# Patient Record
Sex: Female | Born: 1995 | Race: Black or African American | Hispanic: No | Smoking: Current every day smoker
Health system: Southern US, Community
[De-identification: ages and names within clinical notes are randomized; demographics above are authoritative.]

## PROBLEM LIST (undated history)

## (undated) DIAGNOSIS — F909 Attention-deficit hyperactivity disorder, unspecified type: Secondary | ICD-10-CM

## (undated) DIAGNOSIS — R7303 Prediabetes: Secondary | ICD-10-CM

## (undated) DIAGNOSIS — D649 Anemia, unspecified: Secondary | ICD-10-CM

## (undated) DIAGNOSIS — G4700 Insomnia, unspecified: Secondary | ICD-10-CM

## (undated) DIAGNOSIS — R519 Headache, unspecified: Secondary | ICD-10-CM

## (undated) DIAGNOSIS — J45909 Unspecified asthma, uncomplicated: Secondary | ICD-10-CM

## (undated) HISTORY — DX: Anemia, unspecified: D64.9

## (undated) HISTORY — DX: Headache, unspecified: R51.9

---
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
## 2018-09-24 ENCOUNTER — Ambulatory Visit (INDEPENDENT_AMBULATORY_CARE_PROVIDER_SITE_OTHER): Payer: Medicaid Other

## 2018-09-24 ENCOUNTER — Other Ambulatory Visit: Payer: Self-pay

## 2018-09-24 VITALS — BP 112/73 | HR 93 | Ht 65.5 in | Wt 166.0 lb

## 2018-09-24 DIAGNOSIS — Z3201 Encounter for pregnancy test, result positive: Secondary | ICD-10-CM

## 2018-09-24 DIAGNOSIS — Z3400 Encounter for supervision of normal first pregnancy, unspecified trimester: Secondary | ICD-10-CM

## 2018-09-24 LAB — POCT URINE PREGNANCY: Preg Test, Ur: POSITIVE — AB

## 2018-09-24 MED ORDER — BLOOD PRESSURE MONITOR KIT: 1.0000 | PACK | 0 refills | Status: DC

## 2018-09-24 NOTE — Progress Notes (Signed)
PRENATAL INTAKE SUMMARY  Ms. No presents today New OB Nurse Interview.  OB History    Gravida  1   Para  0   Term      Preterm      AB      Living        SAB      TAB      Ectopic      Multiple      Live Births             I have reviewed the patient's medical, obstetrical, social, and family histories, medications, and available lab results.  SUBJECTIVE She has no unusual complaints  OBJECTIVE Initial (New OB) Intake  GENERAL APPEARANCE: alert, well appearing   ASSESSMENT Normal pregnancy  PLAN NEW OB visit and Prenatal Labs.  BP Monitor ordered.

## 2018-10-13 ENCOUNTER — Other Ambulatory Visit: Payer: Self-pay

## 2018-10-13 ENCOUNTER — Ambulatory Visit (INDEPENDENT_AMBULATORY_CARE_PROVIDER_SITE_OTHER): Payer: Medicaid Other | Admitting: Family Medicine

## 2018-10-13 ENCOUNTER — Other Ambulatory Visit (HOSPITAL_COMMUNITY)
Admission: RE | Admit: 2018-10-13 | Discharge: 2018-10-13 | Disposition: A | Discharge status: Home / Self Care | Payer: Medicaid Other | Source: Ambulatory Visit | Attending: Family Medicine | Admitting: Family Medicine

## 2018-10-13 ENCOUNTER — Encounter: Payer: Self-pay | Admitting: Family Medicine

## 2018-10-13 VITALS — BP 105/63 | HR 77 | Temp 98.7°F | Wt 172.7 lb

## 2018-10-13 DIAGNOSIS — Z23 Encounter for immunization: Secondary | ICD-10-CM

## 2018-10-13 DIAGNOSIS — Z8659 Personal history of other mental and behavioral disorders: Secondary | ICD-10-CM

## 2018-10-13 DIAGNOSIS — Z3400 Encounter for supervision of normal first pregnancy, unspecified trimester: Secondary | ICD-10-CM | POA: Diagnosis not present

## 2018-10-13 DIAGNOSIS — Z3402 Encounter for supervision of normal first pregnancy, second trimester: Secondary | ICD-10-CM

## 2018-10-13 DIAGNOSIS — Z3A17 17 weeks gestation of pregnancy: Secondary | ICD-10-CM

## 2018-10-13 MED ORDER — BLOOD PRESSURE KIT DEVI: 1.0000 | 0 refills | Status: DC

## 2018-10-13 NOTE — Progress Notes (Signed)
   Subjective:  Kim Fritz is a 23 y.o. G1P0 at [redacted]w[redacted]d being seen today for ongoing prenatal care.  She is currently monitored for the following issues for this low-risk pregnancy and has Supervision of normal first pregnancy and History of major depression on their problem list.  Patient reports no complaints.  Contractions: Not present. Vag. Bleeding: None.  Movement: Present. Denies leaking of fluid.   The following portions of the patient's history were reviewed and updated as appropriate: allergies, current medications, past family history, past medical history, past social history, past surgical history and problem list. Problem list updated.  Objective:   Vitals:   10/13/18 1025  BP: 105/63  Pulse: 77  Temp: 98.7 F (37.1 C)  Weight: 172 lb 11.2 oz (78.3 kg)    Fetal Status: Fetal Heart Rate (bpm): 145   Movement: Present     General:  Alert, oriented and cooperative. Patient is in no acute distress.  Skin: Skin is warm and dry. No rash noted.   Cardiovascular: Normal heart rate noted  Respiratory: Normal respiratory effort, no problems with respiration noted  Abdomen: Soft, gravid, appropriate for gestational age. Pain/Pressure: Absent     Pelvic: Vag. Bleeding: None     Normal vaginal mucosa without exudate, cervix normal appearance without lesions        Extremities: Normal range of motion.  Edema: None  Mental Status: Normal mood and affect. Normal behavior. Normal judgment and thought content.   Urinalysis:      Assessment and Plan:  Pregnancy: G1P0 at [redacted]w[redacted]d  1. Supervision of normal first pregnancy, antepartum - Firm LMP with regular periods prior to conception - Has supportive partner, they are excited for pregnancy - Elects for genetic testing after counseling - Ordered for anatomy scan - Prenatal lab panel ordered, pap and infectious swab collected - Accepts flu today - Warning signs reviewed   - Cytology - PAP( [place]) - Cervicovaginal ancillary  only( [place]) - Enroll Patient in Babyscripts - Babyscripts Schedule Optimization - Blood Pressure Monitoring (BLOOD PRESSURE KIT) DEVI; 1 kit by Does not apply route once a week. Check BP Weekly.  Large Cuff.  DX: Z13.6  Z34.86  O09.0  Dispense: 1 kit; Refill: 0 - Obstetric Panel, Including HIV - Culture, OB Urine - Genetic Screening - Flu Vaccine QUAD 36+ mos IM (Fluarix, Quad PF) - US MFM OB COMP + 14 WK; Future - Varicella zoster antibody, IgG  2. History of major depression - Endorses remote history in high school of depression and anxiety with medications - No meds currently, denies any mood issues  Preterm labor symptoms and general obstetric precautions including but not limited to vaginal bleeding, contractions, leaking of fluid and fetal movement were reviewed in detail with the patient. Please refer to After Visit Summary for other counseling recommendations.  Return in about 4 weeks (around 11/10/2018) for LRC, in person.   Eckstat, Matthew M, MD  

## 2018-10-13 NOTE — Progress Notes (Signed)
NEW OB.  FLU given in RD, tolerated well.

## 2018-10-13 NOTE — Patient Instructions (Signed)

## 2018-10-14 LAB — CERVICOVAGINAL ANCILLARY ONLY
Bacterial Vaginitis (gardnerella): POSITIVE — AB
Candida Glabrata: NEGATIVE
Candida Vaginitis: NEGATIVE
Molecular Disclaimer: NEGATIVE
Molecular Disclaimer: NEGATIVE
Molecular Disclaimer: NEGATIVE
Molecular Disclaimer: NORMAL
Trichomonas: POSITIVE — AB

## 2018-10-14 LAB — OBSTETRIC PANEL, INCLUDING HIV
Basophils Absolute: 0.1 10*3/uL (ref 0.0–0.2)
Basos: 1 %
EOS (ABSOLUTE): 0.3 10*3/uL (ref 0.0–0.4)
Eos: 3 %
HIV Screen 4th Generation wRfx: NONREACTIVE
Hematocrit: 29.1 % — ABNORMAL LOW (ref 34.0–46.6)
Hemoglobin: 9.4 g/dL — ABNORMAL LOW (ref 11.1–15.9)
Hepatitis B Surface Ag: NEGATIVE
Immature Grans (Abs): 0.1 10*3/uL (ref 0.0–0.1)
Immature Granulocytes: 1 %
Lymphocytes Absolute: 1.3 10*3/uL (ref 0.7–3.1)
Lymphs: 17 %
MCH: 32 pg (ref 26.6–33.0)
MCHC: 32.3 g/dL (ref 31.5–35.7)
MCV: 99 fL — ABNORMAL HIGH (ref 79–97)
Monocytes Absolute: 0.8 10*3/uL (ref 0.1–0.9)
Monocytes: 10 %
Neutrophils Absolute: 5.4 10*3/uL (ref 1.4–7.0)
Neutrophils: 68 %
Platelets: 401 10*3/uL (ref 150–450)
RBC: 2.94 x10E6/uL — ABNORMAL LOW (ref 3.77–5.28)
RDW: 10.8 % — ABNORMAL LOW (ref 11.7–15.4)
RPR Ser Ql: NONREACTIVE
Rh Factor: POSITIVE
Rubella Antibodies, IGG: 1.62 index (ref >0.99)
WBC: 7.9 10*3/uL (ref 3.4–10.8)

## 2018-10-14 LAB — AB SCR+ANTIBODY ID: Antibody Screen: POSITIVE — AB

## 2018-10-14 LAB — VARICELLA ZOSTER ANTIBODY, IGG: Varicella zoster IgG: <135 index — ABNORMAL LOW (ref >165)

## 2018-10-14 MED ORDER — BLOOD PRESSURE KIT DEVI: 1.0000 | 0 refills | Status: DC

## 2018-10-14 NOTE — Addendum Note (Signed)
Addended by: Tamela Oddi on: 10/14/2018 05:01 PM   Modules accepted: Orders

## 2018-10-15 LAB — CERVICOVAGINAL ANCILLARY ONLY
Chlamydia: NEGATIVE
Neisseria Gonorrhea: NEGATIVE

## 2018-10-17 LAB — CYTOLOGY - PAP
Diagnosis: NEGATIVE
High risk HPV: NEGATIVE
Molecular Disclaimer: 56
Molecular Disclaimer: DETECTED
Molecular Disclaimer: NORMAL

## 2018-10-18 ENCOUNTER — Telehealth: Payer: Self-pay

## 2018-10-18 ENCOUNTER — Other Ambulatory Visit: Payer: Self-pay | Admitting: Obstetrics

## 2018-10-18 DIAGNOSIS — A5901 Trichomonal vulvovaginitis: Secondary | ICD-10-CM

## 2018-10-18 DIAGNOSIS — B9689 Other specified bacterial agents as the cause of diseases classified elsewhere: Secondary | ICD-10-CM

## 2018-10-18 LAB — OB RESULTS CONSOLE GBS: GBS: POSITIVE

## 2018-10-18 LAB — CULTURE, OB URINE

## 2018-10-18 LAB — URINE CULTURE, OB REFLEX

## 2018-10-18 MED ORDER — TINIDAZOLE 500 MG PO TABS: 2.0000 g | ORAL_TABLET | Freq: Every day | ORAL | 0 refills | Status: DC

## 2018-10-18 NOTE — Telephone Encounter (Signed)
TC to pt to make aware of results  Pt not ava left message at 9:34am for pt to return call to office.

## 2018-10-18 NOTE — Telephone Encounter (Signed)
-----   Message from Clarnce Flock, MD sent at 10/16/2018  6:03 AM EDT ----- Regarding: Abnormal results Hello,  This patient's swab came back positive for BV and trichomonas but she does not have My Chart. Can someone call her, offer treatment, and offer her partner treatment for the trich, as well as counseling on not having intercourse for at least 1 wk after both of them finish their antibiotics.   Dr. Dione Plover

## 2018-10-21 ENCOUNTER — Encounter: Payer: Self-pay | Admitting: Family Medicine

## 2018-10-21 DIAGNOSIS — O09899 Supervision of other high risk pregnancies, unspecified trimester: Secondary | ICD-10-CM | POA: Insufficient documentation

## 2018-10-21 DIAGNOSIS — O361990 Maternal care for other isoimmunization, unspecified trimester, not applicable or unspecified: Secondary | ICD-10-CM | POA: Insufficient documentation

## 2018-10-21 DIAGNOSIS — R8271 Bacteriuria: Secondary | ICD-10-CM | POA: Insufficient documentation

## 2018-10-22 ENCOUNTER — Other Ambulatory Visit: Payer: Self-pay

## 2018-10-22 DIAGNOSIS — A5901 Trichomonal vulvovaginitis: Secondary | ICD-10-CM

## 2018-10-22 MED ORDER — TINIDAZOLE 500 MG PO TABS: 2.0000 g | ORAL_TABLET | Freq: Once | ORAL | 1 refills | Status: DC

## 2018-10-22 NOTE — Progress Notes (Signed)
Rx sent for Trichomonas

## 2018-10-25 ENCOUNTER — Encounter: Payer: Self-pay | Admitting: Obstetrics and Gynecology

## 2018-10-26 ENCOUNTER — Other Ambulatory Visit (HOSPITAL_COMMUNITY): Payer: Self-pay | Admitting: *Deleted

## 2018-10-26 ENCOUNTER — Other Ambulatory Visit: Payer: Self-pay

## 2018-10-26 ENCOUNTER — Ambulatory Visit (HOSPITAL_COMMUNITY)
Admission: RE | Admit: 2018-10-26 | Discharge: 2018-10-26 | Disposition: A | Discharge status: Home / Self Care | Payer: Medicaid Other | Source: Ambulatory Visit | Attending: Obstetrics and Gynecology | Admitting: Obstetrics and Gynecology

## 2018-10-26 DIAGNOSIS — Z363 Encounter for antenatal screening for malformations: Secondary | ICD-10-CM | POA: Diagnosis not present

## 2018-10-26 DIAGNOSIS — Z3A19 19 weeks gestation of pregnancy: Secondary | ICD-10-CM

## 2018-10-26 DIAGNOSIS — Z3400 Encounter for supervision of normal first pregnancy, unspecified trimester: Secondary | ICD-10-CM

## 2018-10-26 DIAGNOSIS — Z362 Encounter for other antenatal screening follow-up: Secondary | ICD-10-CM

## 2018-10-27 ENCOUNTER — Telehealth: Payer: Self-pay | Admitting: *Deleted

## 2018-10-27 DIAGNOSIS — A5901 Trichomonal vulvovaginitis: Secondary | ICD-10-CM

## 2018-10-27 DIAGNOSIS — B9689 Other specified bacterial agents as the cause of diseases classified elsewhere: Secondary | ICD-10-CM

## 2018-10-27 MED ORDER — TINIDAZOLE 500 MG PO TABS: 2.0000 g | ORAL_TABLET | Freq: Every day | ORAL | 0 refills | Status: DC

## 2018-10-27 NOTE — Telephone Encounter (Signed)
Patient called requesting to change pharmacies and resend medication. Pt stated Tindamax is cheaper at [facility]. Pt did not pick up Rx from [facility] to due cost. Rx resent to [facility] per request.  Derl Barrow, RN

## 2018-11-10 ENCOUNTER — Ambulatory Visit (INDEPENDENT_AMBULATORY_CARE_PROVIDER_SITE_OTHER): Payer: Medicaid Other | Admitting: Family Medicine

## 2018-11-10 ENCOUNTER — Other Ambulatory Visit: Payer: Self-pay

## 2018-11-10 VITALS — BP 101/63 | HR 87 | Wt 179.0 lb

## 2018-11-10 DIAGNOSIS — O361990 Maternal care for other isoimmunization, unspecified trimester, not applicable or unspecified: Secondary | ICD-10-CM

## 2018-11-10 DIAGNOSIS — Z3A21 21 weeks gestation of pregnancy: Secondary | ICD-10-CM

## 2018-11-10 DIAGNOSIS — Z3400 Encounter for supervision of normal first pregnancy, unspecified trimester: Secondary | ICD-10-CM

## 2018-11-10 DIAGNOSIS — A5901 Trichomonal vulvovaginitis: Secondary | ICD-10-CM

## 2018-11-10 DIAGNOSIS — O99820 Streptococcus B carrier state complicating pregnancy: Secondary | ICD-10-CM

## 2018-11-10 DIAGNOSIS — R8271 Bacteriuria: Secondary | ICD-10-CM

## 2018-11-10 DIAGNOSIS — O23592 Infection of other part of genital tract in pregnancy, second trimester: Secondary | ICD-10-CM

## 2018-11-10 DIAGNOSIS — O361920 Maternal care for other isoimmunization, second trimester, not applicable or unspecified: Secondary | ICD-10-CM

## 2018-11-10 NOTE — Patient Instructions (Signed)
Trichomoniasis Trichomoniasis is an STI (sexually transmitted infection) that can affect both women and men. In women, the outer area of the female genitalia (vulva) and the vagina are affected. In men, mainly the penis is affected, but the prostate and other reproductive organs can also be involved.  This condition can be treated with medicine. It often has no symptoms (is asymptomatic), especially in men. If not treated, trichomoniasis can last for months or years. What are the causes? This condition is caused by a parasite called Trichomonas vaginalis. Trichomoniasis most often spreads from person to person (is contagious) through sexual contact. What increases the risk? The following factors may make you more likely to develop this condition:  Having unprotected sex.  Having sex with a partner who has trichomoniasis.  Having multiple sexual partners.  Having had previous trichomoniasis infections or other STIs. What are the signs or symptoms? In women, symptoms of trichomoniasis include:  Abnormal vaginal discharge that is clear, white, gray, or yellow-green and foamy and has an unusual "fishy" odor.  Itching and irritation of the vagina and vulva.  Burning or pain during urination or sex.  Redness and swelling of the genitals. In men, symptoms of trichomoniasis include:  Penile discharge that may be foamy or contain pus.  Pain in the penis. This may happen only when urinating.  Itching or irritation inside the penis.  Burning after urination or ejaculation. How is this diagnosed? In women, this condition may be found during a routine Pap test or physical exam. It may be found in men during a routine physical exam. Your health care provider may do tests to help diagnose this infection, such as:  Urine tests (men and women).  The following in women: ? Testing the pH of the vagina. ? A vaginal swab test that checks for the Trichomonas vaginalis parasite. ? Testing vaginal  secretions. Your health care provider may test you for other STIs, including HIV (human immunodeficiency virus). How is this treated? This condition is treated with medicine taken by mouth (orally), such as metronidazole or tinidazole, to fight the infection. Your sexual partner(s) also need to be tested and treated.  If you are a woman and you plan to become pregnant or think you may be pregnant, tell your health care provider right away. Some medicines that are used to treat the infection should not be taken during pregnancy. Your health care provider may recommend over-the-counter medicines or creams to help relieve itching or irritation. You may be tested for infection again 3 months after treatment. Follow these instructions at home:  Take and use over-the-counter and prescription medicines, including creams, only as told by your health care provider.  Take your antibiotic medicine as told by your health care provider. Do not stop taking the antibiotic even if you start to feel better.  Do not have sex until 7-10 days after you finish your medicine, or until your health care provider approves. Ask your health care provider when you may start to have sex again.  (Women) Do not douche or wear tampons while you have the infection.  Discuss your infection with your sexual partner(s). Make sure that your partner gets tested and treated, if necessary.  Keep all follow-up visits as told by your health care provider. This is important. How is this prevented?   Use condoms every time you have sex. Using condoms correctly and consistently can help protect against STIs.  Avoid having multiple sexual partners.  Talk with your sexual partner about any   symptoms that either of you may have, as well as any history of STIs.  Get tested for STIs and STDs (sexually transmitted diseases) before you have sex. Ask your partner to do the same.  Do not have sexual contact if you have symptoms of  trichomoniasis or another STI. Contact a health care provider if:  You still have symptoms after you finish your medicine.  You develop pain in your abdomen.  You have pain when you urinate.  You have bleeding after sex.  You develop a rash.  You feel nauseous or you vomit.  You plan to become pregnant or think you may be pregnant. Summary  Trichomoniasis is an STI (sexually transmitted infection) that can affect both women and men.  This condition often has no symptoms (is asymptomatic), especially in men.  Without treatment, this condition can last for months or years.  You should not have sex until 7-10 days after you finish your medicine, or until your health care provider approves. Ask your health care provider when you may start to have sex again.  Discuss your infection with your sexual partner(s). Make sure that your partner gets tested and treated, if necessary. This information is not intended to replace advice given to you by your health care provider. Make sure you discuss any questions you have with your health care provider. Document Released: 07/09/2000 Document Revised: 10/27/2017 Document Reviewed: 10/27/2017 Elsevier Patient Education  2020 Elsevier Inc.  

## 2018-11-10 NOTE — Progress Notes (Addendum)
ROB/AFP.  Completed Tindamax  Yesterday, 11/09/18.  C/o eczema on neck

## 2018-11-10 NOTE — Progress Notes (Signed)
Subjective:  Kim Fritz is a 23 y.o. G1P0 at [redacted]w[redacted]d being seen today for ongoing prenatal care.  She is currently monitored for the following issues for this low-risk pregnancy and has Supervision of normal first pregnancy; History of major depression; Anti-M isoimmunization affecting pregnancy, antepartum; GBS bacteriuria; Maternal varicella, non-immune; and Trichomonal vaginitis during pregnancy on their problem list.  Patient reports no complaints.  Contractions: Not present. Vag. Bleeding: None.  Movement: Present. Denies leaking of fluid.   The following portions of the patient's history were reviewed and updated as appropriate: allergies, current medications, past family history, past medical history, past social history, past surgical history and problem list. Problem list updated.  Objective:   Vitals:   11/10/18 1029  BP: 101/63  Pulse: 87  Weight: 179 lb (81.2 kg)    Fetal Status:     Movement: Present     General:  Alert, oriented and cooperative. Patient is in no acute distress.  Skin: Skin is warm and dry. No rash noted.   Cardiovascular: Normal heart rate noted  Respiratory: Normal respiratory effort, no problems with respiration noted  Abdomen: Soft, gravid, appropriate for gestational age. Pain/Pressure: Absent     Pelvic: Vag. Bleeding: None     Cervical exam deferred        Extremities: Normal range of motion.  Edema: None  Mental Status: Normal mood and affect. Normal behavior. Normal judgment and thought content.    Assessment and Plan:  Pregnancy: G1P0 at [redacted]w[redacted]d  1. Supervision of normal first pregnancy, antepartum - Continue routine pre-natal care - AFP, Serum, Open Spina Bifida - Anatomy scan: normal, female fetus  2. Trichomonal vaginitis during pregnancy in second trimester - TOC at next visit, completed Tindamax yesterday  3. GBS bacteriuria - treat as GBS+ in labor  4. Anti-M isoimmunization affecting pregnancy, antepartum, single or unspecified  fetus - Type and Cross on admission   Preterm labor symptoms and general obstetric precautions including but not limited to vaginal bleeding, contractions, leaking of fluid and fetal movement were reviewed in detail with the patient. Please refer to After Visit Summary for other counseling recommendations.  Return in about 4 weeks (around 12/08/2018) for LOB, Trich TOC.   Mindel Friscia L, DO

## 2018-11-15 ENCOUNTER — Encounter: Payer: Self-pay | Admitting: Family Medicine

## 2018-11-15 DIAGNOSIS — O0932 Supervision of pregnancy with insufficient antenatal care, second trimester: Secondary | ICD-10-CM | POA: Insufficient documentation

## 2018-11-16 LAB — AFP, SERUM, OPEN SPINA BIFIDA
AFP MoM: 1.58
AFP Value: 111.1 ng/mL
Gest. Age on Collection Date: 21.7 weeks
Maternal Age At EDD: 24.5 yr
OSBR Risk 1 IN: 4424
Test Results:: NEGATIVE
Weight: 179 [lb_av]

## 2018-11-23 ENCOUNTER — Other Ambulatory Visit: Payer: Self-pay

## 2018-11-23 ENCOUNTER — Ambulatory Visit (HOSPITAL_COMMUNITY)
Admission: RE | Admit: 2018-11-23 | Discharge: 2018-11-23 | Disposition: A | Discharge status: Home / Self Care | Payer: Medicaid Other | Source: Ambulatory Visit | Attending: Obstetrics and Gynecology | Admitting: Obstetrics and Gynecology

## 2018-11-23 ENCOUNTER — Other Ambulatory Visit (HOSPITAL_COMMUNITY): Payer: Self-pay | Admitting: *Deleted

## 2018-11-23 DIAGNOSIS — O365920 Maternal care for other known or suspected poor fetal growth, second trimester, not applicable or unspecified: Secondary | ICD-10-CM | POA: Diagnosis not present

## 2018-11-23 DIAGNOSIS — Z3A23 23 weeks gestation of pregnancy: Secondary | ICD-10-CM | POA: Diagnosis not present

## 2018-11-23 DIAGNOSIS — Z362 Encounter for other antenatal screening follow-up: Secondary | ICD-10-CM | POA: Insufficient documentation

## 2018-12-08 ENCOUNTER — Other Ambulatory Visit: Payer: Self-pay

## 2018-12-08 ENCOUNTER — Ambulatory Visit (INDEPENDENT_AMBULATORY_CARE_PROVIDER_SITE_OTHER): Payer: Medicaid Other | Admitting: Certified Nurse Midwife

## 2018-12-08 ENCOUNTER — Other Ambulatory Visit (HOSPITAL_COMMUNITY)
Admission: RE | Admit: 2018-12-08 | Discharge: 2018-12-08 | Disposition: A | Discharge status: Home / Self Care | Payer: Medicaid Other | Source: Ambulatory Visit | Attending: Certified Nurse Midwife | Admitting: Certified Nurse Midwife

## 2018-12-08 ENCOUNTER — Encounter: Payer: Self-pay | Admitting: Certified Nurse Midwife

## 2018-12-08 VITALS — BP 104/67 | HR 73 | Wt 190.0 lb

## 2018-12-08 DIAGNOSIS — O23592 Infection of other part of genital tract in pregnancy, second trimester: Secondary | ICD-10-CM | POA: Diagnosis present

## 2018-12-08 DIAGNOSIS — A5901 Trichomonal vulvovaginitis: Secondary | ICD-10-CM | POA: Diagnosis present

## 2018-12-08 DIAGNOSIS — Z3400 Encounter for supervision of normal first pregnancy, unspecified trimester: Secondary | ICD-10-CM

## 2018-12-08 DIAGNOSIS — Z3A25 25 weeks gestation of pregnancy: Secondary | ICD-10-CM

## 2018-12-08 MED ORDER — TINIDAZOLE 500 MG PO TABS: 2.0000 g | ORAL_TABLET | Freq: Once | ORAL | 1 refills | Status: AC

## 2018-12-08 NOTE — Progress Notes (Signed)
ROB/TOC. 

## 2018-12-08 NOTE — Patient Instructions (Signed)
Glucose Tolerance Test During Pregnancy Why am I having this test? The glucose tolerance test (GTT) is done to check how your body processes sugar (glucose). This is one of several tests used to diagnose diabetes that develops during pregnancy (gestational diabetes mellitus). Gestational diabetes is a temporary form of diabetes that some women develop during pregnancy. It usually occurs during the second trimester of pregnancy and goes away after delivery. Testing (screening) for gestational diabetes usually occurs between 24 and 28 weeks of pregnancy. You may have the GTT test after having a 1-hour glucose screening test if the results from that test indicate that you may have gestational diabetes. You may also have this test if:  You have a history of gestational diabetes.  You have a history of giving birth to very large babies or have experienced repeated fetal loss (stillbirth).  You have signs and symptoms of diabetes, such as: ? Changes in your vision. ? Tingling or numbness in your hands or feet. ? Changes in hunger, thirst, and urination that are not otherwise explained by your pregnancy. What is being tested? This test measures the amount of glucose in your blood at different times during a period of 3 hours. This indicates how well your body is able to process glucose. What kind of sample is taken?  Blood samples are required for this test. They are usually collected by inserting a needle into a blood vessel. How do I prepare for this test?  For 3 days before your test, eat normally. Have plenty of carbohydrate-rich foods.  Follow instructions from your health care provider about: ? Eating or drinking restrictions on the day of the test. You may be asked to not eat or drink anything other than water (fast) starting 8-10 hours before the test. ? Changing or stopping your regular medicines. Some medicines may interfere with this test. Tell a health care provider about:  All  medicines you are taking, including vitamins, herbs, eye drops, creams, and over-the-counter medicines.  Any blood disorders you have.  Any surgeries you have had.  Any medical conditions you have. What happens during the test? First, your blood glucose will be measured. This is referred to as your fasting blood glucose, since you fasted before the test. Then, you will drink a glucose solution that contains a certain amount of glucose. Your blood glucose will be measured again 1, 2, and 3 hours after drinking the solution. This test takes about 3 hours to complete. You will need to stay at the testing location during this time. During the testing period:  Do not eat or drink anything other than the glucose solution.  Do not exercise.  Do not use any products that contain nicotine or tobacco, such as cigarettes and e-cigarettes. If you need help stopping, ask your health care provider. The testing procedure may vary among health care providers and hospitals. How are the results reported? Your results will be reported as milligrams of glucose per deciliter of blood (mg/dL) or millimoles per liter (mmol/L). Your health care provider will compare your results to normal ranges that were established after testing a large group of people (reference ranges). Reference ranges may vary among labs and hospitals. For this test, common reference ranges are:  Fasting: less than 95-105 mg/dL (5.3-5.8 mmol/L).  1 hour after drinking glucose: less than 180-190 mg/dL (10.0-10.5 mmol/L).  2 hours after drinking glucose: less than 155-165 mg/dL (8.6-9.2 mmol/L).  3 hours after drinking glucose: 140-145 mg/dL (7.8-8.1 mmol/L). What do the   results mean? Results within reference ranges are considered normal, meaning that your glucose levels are well-controlled. If two or more of your blood glucose levels are high, you may be diagnosed with gestational diabetes. If only one level is high, your health care  provider may suggest repeat testing or other tests to confirm a diagnosis. Talk with your health care provider about what your results mean. Questions to ask your health care provider Ask your health care provider, or the department that is doing the test:  When will my results be ready?  How will I get my results?  What are my treatment options?  What other tests do I need?  What are my next steps? Summary  The glucose tolerance test (GTT) is one of several tests used to diagnose diabetes that develops during pregnancy (gestational diabetes mellitus). Gestational diabetes is a temporary form of diabetes that some women develop during pregnancy.  You may have the GTT test after having a 1-hour glucose screening test if the results from that test indicate that you may have gestational diabetes. You may also have this test if you have any symptoms or risk factors for gestational diabetes.  Talk with your health care provider about what your results mean. This information is not intended to replace advice given to you by your health care provider. Make sure you discuss any questions you have with your health care provider. Document Released: 07/15/2011 Document Revised: 05/06/2018 Document Reviewed: 08/25/2016 Elsevier Patient Education  2020 [employer]  

## 2018-12-08 NOTE — Progress Notes (Addendum)
   PRENATAL VISIT NOTE  Subjective:  Kim Fritz is a 23 y.o. G1P0 at [redacted]w[redacted]d being seen today for ongoing prenatal care.  She is currently monitored for the following issues for this low-risk pregnancy and has Supervision of normal first pregnancy; History of major depression; Anti-M isoimmunization affecting pregnancy, antepartum; GBS bacteriuria; Maternal varicella, non-immune; Trichomonal vaginitis during pregnancy; and Late prenatal care affecting pregnancy in second trimester on their problem list.  Patient reports no complaints. Pt reports that she completed her Tindamax for Trichomonas infections, however her partner has not been treated due to waiting on his insurance. She endorses having protected intercourse with condoms.  Contractions: Not present. Vag. Bleeding: None.  Movement: Present. Denies leaking of fluid.   The following portions of the patient's history were reviewed and updated as appropriate: allergies, current medications, past family history, past medical history, past social history, past surgical history and problem list.   Objective:   Vitals:   12/08/18 1105  BP: 104/67  Pulse: 73  Weight: 190 lb (86.2 kg)    Fetal Status: Fetal Heart Rate (bpm): 144 Fundal Height: 24 cm Movement: Present     General:  Alert, oriented and cooperative. Patient is in no acute distress.  Skin: Skin is warm and dry. No rash noted.   Cardiovascular: Normal heart rate noted  Respiratory: Normal respiratory effort, no problems with respiration noted  Abdomen: Soft, gravid, appropriate for gestational age.  Pain/Pressure: Absent     Pelvic: Cervical exam deferred        Extremities: Normal range of motion.  Edema: None  Mental Status: Normal mood and affect. Normal behavior. Normal judgment and thought content.   Assessment and Plan:  Pregnancy: G1P0 at [redacted]w[redacted]d 1. Supervision of normal first pregnancy, antepartum - Pt here for routine prenatal - No complaints today - Anticipatory  guidance for upcoming appointments and 2hr GTT at next visit  2. Trichomonal vaginitis during pregnancy in second trimester - TOC today - Cervicovaginal ancillary only( [place]) - Expedited partner treatment as partner still has not been treated - Pt given 1 refill for herself in case TOC is positive for Trichomonas today - Educated patient on abstaining from intercourse for 7 days after both parties have been treated   - tinidazole (TINDAMAX) 500 MG tablet; Take 4 tablets (2,000 mg total) by mouth once for 1 dose.  Dispense: 4 tablet; Refill: 1   Preterm labor symptoms and general obstetric precautions including but not limited to vaginal bleeding, contractions, leaking of fluid and fetal movement were reviewed in detail with the patient. Please refer to After Visit Summary for other counseling recommendations.   Return in about 22 days (around 12/30/2018) for ROB/GTT.  Future Appointments  Date Time Provider [place]  12/14/2018  2:30 PM [facility]  12/14/2018  2:30 PM [place] 1 [facility]  12/30/2018  8:45 AM [facility] None  12/30/2018  9:45 AM Lajean Manes, CNM [facility] None    Maryagnes Amos, SNM

## 2018-12-09 LAB — CERVICOVAGINAL ANCILLARY ONLY
Bacterial Vaginitis (gardnerella): NEGATIVE
Comment: NEGATIVE
Comment: NEGATIVE
Trichomonas: NEGATIVE

## 2018-12-14 ENCOUNTER — Other Ambulatory Visit: Payer: Self-pay

## 2018-12-14 ENCOUNTER — Ambulatory Visit (HOSPITAL_COMMUNITY)
Admission: RE | Admit: 2018-12-14 | Discharge: 2018-12-14 | Disposition: A | Discharge status: Home / Self Care | Payer: Medicaid Other | Source: Ambulatory Visit | Attending: Obstetrics and Gynecology | Admitting: Obstetrics and Gynecology

## 2018-12-14 ENCOUNTER — Ambulatory Visit (HOSPITAL_COMMUNITY): Payer: Medicaid Other | Admitting: *Deleted

## 2018-12-14 ENCOUNTER — Other Ambulatory Visit (HOSPITAL_COMMUNITY): Payer: Self-pay | Admitting: *Deleted

## 2018-12-14 ENCOUNTER — Encounter (HOSPITAL_COMMUNITY): Payer: Self-pay | Admitting: *Deleted

## 2018-12-14 ENCOUNTER — Other Ambulatory Visit (HOSPITAL_COMMUNITY): Payer: Self-pay | Admitting: Obstetrics

## 2018-12-14 DIAGNOSIS — O0932 Supervision of pregnancy with insufficient antenatal care, second trimester: Secondary | ICD-10-CM

## 2018-12-14 DIAGNOSIS — Z3A26 26 weeks gestation of pregnancy: Secondary | ICD-10-CM | POA: Diagnosis not present

## 2018-12-14 DIAGNOSIS — O365920 Maternal care for other known or suspected poor fetal growth, second trimester, not applicable or unspecified: Secondary | ICD-10-CM

## 2018-12-14 DIAGNOSIS — Z362 Encounter for other antenatal screening follow-up: Secondary | ICD-10-CM | POA: Diagnosis not present

## 2018-12-29 ENCOUNTER — Ambulatory Visit (HOSPITAL_COMMUNITY)
Admission: RE | Admit: 2018-12-29 | Discharge: 2018-12-29 | Disposition: A | Discharge status: Home / Self Care | Payer: Medicaid Other | Source: Ambulatory Visit | Attending: Obstetrics and Gynecology | Admitting: Obstetrics and Gynecology

## 2018-12-29 ENCOUNTER — Ambulatory Visit (HOSPITAL_COMMUNITY): Payer: Medicaid Other | Admitting: *Deleted

## 2018-12-29 ENCOUNTER — Encounter (HOSPITAL_COMMUNITY): Payer: Self-pay

## 2018-12-29 ENCOUNTER — Other Ambulatory Visit: Payer: Self-pay

## 2018-12-29 DIAGNOSIS — Z3A28 28 weeks gestation of pregnancy: Secondary | ICD-10-CM

## 2018-12-29 DIAGNOSIS — O365930 Maternal care for other known or suspected poor fetal growth, third trimester, not applicable or unspecified: Secondary | ICD-10-CM

## 2018-12-29 DIAGNOSIS — O0932 Supervision of pregnancy with insufficient antenatal care, second trimester: Secondary | ICD-10-CM

## 2018-12-29 NOTE — Progress Notes (Signed)
   PRENATAL VISIT NOTE  Subjective:  Kim Fritz is a 23 y.o. G1P0 at [redacted]w[redacted]d being seen today for ongoing prenatal care.  She is currently monitored for the following issues for this high-risk pregnancy and has Supervision of normal first pregnancy; History of major depression; Anti-M isoimmunization affecting pregnancy, antepartum; GBS bacteriuria; Maternal varicella, non-immune; Trichomonal vaginitis during pregnancy; and Late prenatal care affecting pregnancy in second trimester on their problem list.  Patient reports no complaints.  Contractions: Not present. Vag. Bleeding: None.  Movement: Present. Denies leaking of fluid.   The following portions of the patient's history were reviewed and updated as appropriate: allergies, current medications, past family history, past medical history, past social history, past surgical history and problem list.   Objective:   Vitals:   12/30/18 0914  BP: 111/71  Pulse: 84  Weight: 191 lb 3.2 oz (86.7 kg)    Fetal Status: Fetal Heart Rate (bpm): 145   Movement: Present     General:  Alert, oriented and cooperative. Patient is in no acute distress.  Skin: Skin is warm and dry. No rash noted.   Cardiovascular: Normal heart rate noted  Respiratory: Normal respiratory effort, no problems with respiration noted  Abdomen: Soft, gravid, appropriate for gestational age.  Pain/Pressure: Absent     Pelvic: Cervical exam deferred        Extremities: Normal range of motion.  Edema: Trace  Mental Status: Normal mood and affect. Normal behavior. Normal judgment and thought content.   Assessment and Plan:  Pregnancy: G1P0 at [redacted]w[redacted]d 1. Encounter for supervision of normal first pregnancy in third trimester - Patient doing well, no complaints - Anticipatory guidance on upcoming appointments  - Cod doppler US completed yesterday normal  - ? SGA vs IUGR based on last [place] on 11/17 - EFW 5% with AC 4%, f/u cord dopplers and fetal growth scheduled  - Follow up US  for fetal growth scheduled for 12/9  - Educated and discussed with patient that diagnoses of IUGR will be based on follow up fetal growth [place] next week- discussed physiology of IUGR and what to expect with follow ups, answered patient's questions.   2. History of major depression - Patient reports hx of depression mostly throughout high school  - Denies being on any medication for depression  - Denies any s/s of depression at this time or needing any additional support   3. GBS bacteriuria - Treat in labor   Preterm labor symptoms and general obstetric precautions including but not limited to vaginal bleeding, contractions, leaking of fluid and fetal movement were reviewed in detail with the patient. Please refer to After Visit Summary for other counseling recommendations.   Return in about 2 weeks (around 01/13/2019) for ROB-mychart.  Future Appointments  Date Time Provider [place]  01/05/2019  3:30 PM [facility]URSE [facility]  01/05/2019  3:30 PM [place] 1 [facility]  01/12/2019  4:00 PM Shelly Bombard, MD [place] None    Lajean Manes, CNM

## 2018-12-30 ENCOUNTER — Ambulatory Visit (INDEPENDENT_AMBULATORY_CARE_PROVIDER_SITE_OTHER): Payer: Medicaid Other | Admitting: Certified Nurse Midwife

## 2018-12-30 ENCOUNTER — Encounter: Payer: Self-pay | Admitting: Certified Nurse Midwife

## 2018-12-30 ENCOUNTER — Other Ambulatory Visit: Payer: Medicaid Other

## 2018-12-30 VITALS — BP 111/71 | HR 84 | Wt 191.2 lb

## 2018-12-30 DIAGNOSIS — Z3403 Encounter for supervision of normal first pregnancy, third trimester: Secondary | ICD-10-CM

## 2018-12-30 DIAGNOSIS — D509 Iron deficiency anemia, unspecified: Secondary | ICD-10-CM

## 2018-12-30 DIAGNOSIS — R8271 Bacteriuria: Secondary | ICD-10-CM

## 2018-12-30 DIAGNOSIS — Z3A28 28 weeks gestation of pregnancy: Secondary | ICD-10-CM

## 2018-12-30 DIAGNOSIS — Z8659 Personal history of other mental and behavioral disorders: Secondary | ICD-10-CM

## 2018-12-30 DIAGNOSIS — Z23 Encounter for immunization: Secondary | ICD-10-CM

## 2018-12-30 DIAGNOSIS — O99013 Anemia complicating pregnancy, third trimester: Secondary | ICD-10-CM

## 2018-12-30 DIAGNOSIS — O99019 Anemia complicating pregnancy, unspecified trimester: Secondary | ICD-10-CM

## 2018-12-30 NOTE — Patient Instructions (Signed)

## 2018-12-31 LAB — CBC
Hematocrit: 28.2 % — ABNORMAL LOW (ref 34.0–46.6)
Hemoglobin: 9.5 g/dL — ABNORMAL LOW (ref 11.1–15.9)
MCH: 33.3 pg — ABNORMAL HIGH (ref 26.6–33.0)
MCHC: 33.7 g/dL (ref 31.5–35.7)
MCV: 99 fL — ABNORMAL HIGH (ref 79–97)
Platelets: 361 10*3/uL (ref 150–450)
RBC: 2.85 x10E6/uL — ABNORMAL LOW (ref 3.77–5.28)
RDW: 11 % — ABNORMAL LOW (ref 11.7–15.4)
WBC: 10.2 10*3/uL (ref 3.4–10.8)

## 2018-12-31 LAB — GLUCOSE TOLERANCE, 2 HOURS W/ 1HR
Glucose, 1 hour: 89 mg/dL (ref 65–179)
Glucose, 2 hour: 93 mg/dL (ref 65–152)
Glucose, Fasting: 70 mg/dL (ref 65–91)

## 2018-12-31 LAB — RPR: RPR Ser Ql: NONREACTIVE

## 2018-12-31 LAB — HIV ANTIBODY (ROUTINE TESTING W REFLEX): HIV Screen 4th Generation wRfx: NONREACTIVE

## 2019-01-03 ENCOUNTER — Encounter: Payer: Self-pay | Admitting: Certified Nurse Midwife

## 2019-01-03 DIAGNOSIS — O99019 Anemia complicating pregnancy, unspecified trimester: Secondary | ICD-10-CM | POA: Insufficient documentation

## 2019-01-03 DIAGNOSIS — D509 Iron deficiency anemia, unspecified: Secondary | ICD-10-CM | POA: Insufficient documentation

## 2019-01-03 MED ORDER — FERROUS SULFATE 325 (65 FE) MG PO TABS: 325.0000 mg | ORAL_TABLET | Freq: Two times a day (BID) | ORAL | 2 refills | Status: DC

## 2019-01-03 NOTE — Addendum Note (Signed)
Addended by: Lajean Manes on: 01/03/2019 04:06 PM   Modules accepted: Orders

## 2019-01-05 ENCOUNTER — Other Ambulatory Visit (HOSPITAL_COMMUNITY): Payer: Self-pay | Admitting: *Deleted

## 2019-01-05 ENCOUNTER — Ambulatory Visit (HOSPITAL_COMMUNITY): Payer: Medicaid Other | Admitting: *Deleted

## 2019-01-05 ENCOUNTER — Encounter (HOSPITAL_COMMUNITY): Payer: Self-pay

## 2019-01-05 ENCOUNTER — Ambulatory Visit (HOSPITAL_COMMUNITY)
Admission: RE | Admit: 2019-01-05 | Discharge: 2019-01-05 | Disposition: A | Discharge status: Home / Self Care | Payer: Medicaid Other | Source: Ambulatory Visit | Attending: Obstetrics and Gynecology | Admitting: Obstetrics and Gynecology

## 2019-01-05 ENCOUNTER — Other Ambulatory Visit: Payer: Self-pay

## 2019-01-05 DIAGNOSIS — Z362 Encounter for other antenatal screening follow-up: Secondary | ICD-10-CM | POA: Diagnosis not present

## 2019-01-05 DIAGNOSIS — Z3A29 29 weeks gestation of pregnancy: Secondary | ICD-10-CM | POA: Diagnosis not present

## 2019-01-05 DIAGNOSIS — O365990 Maternal care for other known or suspected poor fetal growth, unspecified trimester, not applicable or unspecified: Secondary | ICD-10-CM

## 2019-01-05 DIAGNOSIS — O0932 Supervision of pregnancy with insufficient antenatal care, second trimester: Secondary | ICD-10-CM | POA: Diagnosis present

## 2019-01-05 DIAGNOSIS — O99019 Anemia complicating pregnancy, unspecified trimester: Secondary | ICD-10-CM | POA: Diagnosis present

## 2019-01-05 DIAGNOSIS — O365930 Maternal care for other known or suspected poor fetal growth, third trimester, not applicable or unspecified: Secondary | ICD-10-CM

## 2019-01-05 DIAGNOSIS — D509 Iron deficiency anemia, unspecified: Secondary | ICD-10-CM | POA: Insufficient documentation

## 2019-01-12 ENCOUNTER — Encounter: Payer: Self-pay | Admitting: Obstetrics

## 2019-01-12 ENCOUNTER — Telehealth (INDEPENDENT_AMBULATORY_CARE_PROVIDER_SITE_OTHER): Payer: Medicaid Other | Admitting: Obstetrics

## 2019-01-12 VITALS — BP 106/80 | HR 113

## 2019-01-12 DIAGNOSIS — Z3A30 30 weeks gestation of pregnancy: Secondary | ICD-10-CM

## 2019-01-12 DIAGNOSIS — Z3400 Encounter for supervision of normal first pregnancy, unspecified trimester: Secondary | ICD-10-CM

## 2019-01-12 DIAGNOSIS — O99013 Anemia complicating pregnancy, third trimester: Secondary | ICD-10-CM

## 2019-01-12 DIAGNOSIS — D509 Iron deficiency anemia, unspecified: Secondary | ICD-10-CM

## 2019-01-12 DIAGNOSIS — M549 Dorsalgia, unspecified: Secondary | ICD-10-CM

## 2019-01-12 MED ORDER — COMFORT FIT MATERNITY SUPP SM MISC: 0 refills | Status: DC

## 2019-01-12 NOTE — Progress Notes (Signed)
TELEHEALTH OBSTETRICS PRENATAL VIRTUAL VIDEO VISIT ENCOUNTER NOTE  Provider location: [facility] [employer] [facility] [place]   I connected with [employer] on 01/12/19 at  4:00 PM EST by [facility] MyChart Video Encounter at home and verified that I am speaking with the correct person using two identifiers.   I discussed the limitations, risks, security and privacy concerns of performing an evaluation and management service virtually and the availability of in person appointments. I also discussed with the patient that there may be a patient responsible charge related to this service. The patient expressed understanding and agreed to proceed. Subjective:  Kim Fritz is a 23 y.o. G1P0 at [redacted]w[redacted]d being seen today for ongoing prenatal care.  She is currently monitored for the following issues for this low-risk pregnancy and has Supervision of normal first pregnancy; History of major depression; Anti-M isoimmunization affecting pregnancy, antepartum; GBS bacteriuria; Maternal varicella, non-immune; Trichomonal vaginitis during pregnancy; Late prenatal care affecting pregnancy in second trimester; and Iron deficiency anemia during pregnancy on their problem list.  Patient reports backache.  Contractions: Not present. Vag. Bleeding: None.  Movement: Present. Denies any leaking of fluid.   The following portions of the patient's history were reviewed and updated as appropriate: allergies, current medications, past family history, past medical history, past social history, past surgical history and problem list.   Objective:   Vitals:   01/12/19 1555  BP: 106/80  Pulse: (!) 113    Fetal Status:     Movement: Present     General:  Alert, oriented and cooperative. Patient is in no acute distress.  Respiratory: Normal respiratory effort, no problems with respiration noted  Mental Status: Normal mood and affect. Normal behavior. Normal judgment and thought content.  Rest of physical exam deferred due to  type of encounter  Imaging: [place] MFM OB FOLLOW UP  Result Date: 01/05/2019 ----------------------------------------------------------------------  OBSTETRICS REPORT                       (Signed Final 01/05/2019 04:52 pm) ---------------------------------------------------------------------- Patient Info  ID #:       [id number]                          D.O.B.:  Jul 09, 1995 (23 yrs)  Name:       Kim Fritz                     Visit Date: 01/05/2019 03:53 pm ---------------------------------------------------------------------- Performed By  Performed By:     Tommie Raymond BS,       Secondary Phy.:   [facility]                    RDMS, RVT  Attending:        Noralee Space MD        Address:          [address]  [address]                                                             [place]                                                             [address]  Referred By:      [facility]             Location[facility]         [facility]                                                             [facility]  Ref. Address:     [address]                    [address]                    [place]                    [address] ---------------------------------------------------------------------- Orders   #  Description                          Code         Ordered By   1  [place] MFM OB FOLLOW UP                  [id number]     [facility]   2  [place] MFM UA CORD DOPPLER               [id number]     [facility]  ----------------------------------------------------------------------   #  Order #                    Accession #                 Episode #   1  [id number]                  [id number]                  [id number]   2  [id number]                  [id number]                  [id number]  ---------------------------------------------------------------------- Indications   [redacted] weeks  gestation of pregnancy                Z3A.29   Maternal care for known or suspected poor      O36.5930   fetal growth, third trimester, not applicable or  unspecified IUGR   Low Risk NIPS   Encounter for other antenatal screening        Z36.2   follow-up  ---------------------------------------------------------------------- Vital Signs                                                 Height:        5'5" ---------------------------------------------------------------------- Fetal Evaluation  Num Of Fetuses:         1  Fetal Heart Rate(bpm):  142  Cardiac Activity:       Observed  Presentation:           Cephalic  Placenta:               Posterior  P. Cord Insertion:      Previously Visualized  Amniotic Fluid  AFI FV:      Within normal limits  AFI Sum(cm)     %Tile       Largest Pocket(cm)  13.54           42          3.76  RUQ(cm)       RLQ(cm)       LUQ(cm)        LLQ(cm)  2.89          3.76          3.66           3.23 ---------------------------------------------------------------------- Biometry  BPD:      68.8  mm     G. Age:  27w 5d          2  %    CI:        71.92   %    70 - 86                                                          FL/HC:      21.2   %    19.2 - 21.4  HC:      258.2  mm     G. Age:  28w 0d        < 1  %    HC/AC:      1.09        0.99 - 1.21  AC:      236.4  mm     G. Age:  28w 0d          6  %    FL/BPD:     79.7   %    71 - 87  FL:       54.8  mm     G. Age:  29w 0d         16  %    FL/AC:      23.2   %    20 - 24  HUM:      47.5  mm     G. Age:  27w 6d         14  %  CER:      35.7  mm     G. Age:  30w 5d  64  %  LV:        3.7  mm  Est. FW:    1202  gm    2 lb 10 oz       6  % ---------------------------------------------------------------------- OB History  Gravidity:    1         Term:   0        Prem:   0        SAB:   0  TOP:          0       Ectopic:  0        Living: 0 ---------------------------------------------------------------------- Gestational Age  LMP:            29w 5d        Date:  06/11/18                 EDD:   03/18/19  U/S Today:     28w 1d                                        EDD:   03/29/19  Best:          29w 5d     Det. By:  LMP  (06/11/18)          EDD:   03/18/19 ---------------------------------------------------------------------- Anatomy  Cranium:               Appears normal         LVOT:                   Appears normal  Cavum:                 Appears normal         Aortic Arch:            Appears normal  Ventricles:            Appears normal         Ductal Arch:            Appears normal  Choroid Plexus:        Previously seen        Diaphragm:              Appears normal  Cerebellum:            Appears normal         Stomach:                Appears normal, left                                                                        sided  Posterior Fossa:       Previously seen        Abdomen:                Previously seen  Nuchal Fold:           Previously seen        Abdominal Wall:         Previously seen  Face:  Orbits and profile     Cord Vessels:           Previously seen                         previously seen  Lips:                  Previously seen        Kidneys:                Appear normal  Palate:                Previously seen        Bladder:                Appears normal  Thoracic:              Previously seen        Spine:                  Previously seen  Heart:                 Appears normal         Upper Extremities:      Previously seen                         (4CH, axis, and                         situs)  RVOT:                  Appears normal         Lower Extremities:      Previously seen  Other:  Feet/heels visualized previoiusly. Nasal bone visualized previously.          Open hands previously visualized. ---------------------------------------------------------------------- Doppler - Fetal Vessels  Umbilical Artery   S/D     %tile     RI              PI                     ADFV    RDFV   2.6       34   0.62              0.91                        No      No ---------------------------------------------------------------------- Cervix Uterus Adnexa  Cervix  Not visualized (advanced GA >24wks)  Uterus  No abnormality visualized.  Left Ovary  Within normal limits. No adnexal mass visualized.  Right Ovary  Within normal limits. No adnexal mass visualized.  Cul De Sac  No free fluid seen.  Adnexa  No abnormality visualized. ---------------------------------------------------------------------- Impression  Fetal growth restriction.  Patient return for fetal growth  assessment and umbilical artery Doppler studies.  On ultrasound, the estimated fetal weight is at the 6th  percentile.  Adequate interval weight gain is seen (418 g).  Amniotic fluid is normal good fetal activity seen.  Umbilical  artery Doppler showed normal forward diastolic flow.  I reassured the patient of the findings. ---------------------------------------------------------------------- Recommendations  -An appointment was made for her to return in 2 weeks for  umbilical artery Doppler studies.  -Fetal growth and UA Doppler in 3 weeks. ----------------------------------------------------------------------  Tama High, MD Electronically Signed Final Report   01/05/2019 04:52 pm ----------------------------------------------------------------------  [place] MFM OB FOLLOW UP  Result Date: 12/14/2018 ----------------------------------------------------------------------  OBSTETRICS REPORT                       (Signed Final 12/14/2018 05:40 pm) ---------------------------------------------------------------------- Patient Info  ID #:       [id number]                          D.O.B.:  1995-04-09 (23 yrs)  Name:       Cleaster Corin Kosinski                     Visit Date: 12/14/2018 02:55 pm ---------------------------------------------------------------------- Performed By  Performed By:     Jeanene Erb BS,      Secondary Phy.:   [facility]   Attending:        Tama High MD        Address:          [address]                                                             [address] [place]                                                             [place]  Referred By:      [place]             Location:         [facility]  Ref. Address:     [address]  [address]                    [place]                    [address] ---------------------------------------------------------------------- Orders   #  Description                          Code         Ordered By   1  [place] MFM OB FOLLOW UP                  [phone]     Rosana Hoes   2  [place] MFM UA CORD DOPPLER               [id number]     [facility]ANG  ----------------------------------------------------------------------   #  Order #                    Accession #                 Episode #   1  [id number]                  [id number]                  [id number]   2  [id number]                  [id number]                  [id number]  ---------------------------------------------------------------------- Indications   Maternal care for known or suspected poor      O36.5920   fetal growth, second trimester, not applicable   or unspecified IUGR   Low Risk NIPS   Encounter for other antenatal screening        Z36.2   follow-up   [redacted] weeks gestation of pregnancy                Z3A.26  ---------------------------------------------------------------------- Fetal Evaluation  Num Of Fetuses:         1  Fetal Heart Rate(bpm):  146  Cardiac Activity:       Observed  Presentation:           Cephalic  Placenta:               Posterior  P. Cord Insertion:      Previously Visualized  Amniotic Fluid  AFI FV:      Within normal limits                              Largest Pocket(cm)                               4.1 ---------------------------------------------------------------------- Biometry  BPD:      59.9  mm     G. Age:  24w 3d          1  %    CI:         67.1   %    70 - 86  FL/HC:      20.3   %    18.6 - 20.4  HC:      234.2  mm     G. Age:  25w 3d          4  %    HC/AC:      1.16        1.05 - 1.21  AC:      201.5  mm     G. Age:  24w 5d          4  %    FL/BPD:     79.3   %    71 - 87  FL:       47.5  mm     G. Age:  25w 6d         17  %    FL/AC:      23.6   %    20 - 24  Est. FW:     784  gm    1 lb 12 oz       5  % ---------------------------------------------------------------------- OB History  Gravidity:    1         Term:   0        Prem:   0        SAB:   0  TOP:          0       Ectopic:  0        Living: 0 ---------------------------------------------------------------------- Gestational Age  LMP:           26w 4d        Date:  06/11/18                 EDD:   03/18/19  U/S Today:     25w 1d                                        EDD:   03/28/19  Best:          26w 4d     Det. By:  LMP  (06/11/18)          EDD:   03/18/19 ---------------------------------------------------------------------- Anatomy  Cranium:               Appears normal         LVOT:                   Previously seen  Cavum:                 Previously seen        Aortic Arch:            Previously seen  Ventricles:            Appears normal         Ductal Arch:            Previously seen  Choroid Plexus:        Previously seen        Diaphragm:              Previously seen  Cerebellum:            Previously seen        Stomach:  Appears normal, left                                                                        sided  Posterior Fossa:       Previously seen        Abdomen:                Appears normal  Nuchal Fold:           Previously seen        Abdominal Wall:         Appears nml (cord                                                                         insert, abd wall)  Face:                  Orbits and profile     Cord Vessels:           Appears normal (3                         previously seen                                vessel cord)  Lips:                  Appears normal         Kidneys:                Appear normal  Palate:                Previously seen        Bladder:                Appears normal  Thoracic:              Previously seen        Spine:                  Previously seen  Heart:                 Appears normal         Upper Extremities:      Previously seen                         (4CH, axis, and                         situs)  RVOT:                  Previously seen        Lower Extremities:      Previously seen  Other:  Feet/heels visualized. Nasal bone visualized. Open hands previously  visualized. Technically difficult due to fetal position. ---------------------------------------------------------------------- Doppler - Fetal Vessels  Umbilical Artery   S/D     %tile     RI              PI                     ADFV    RDFV  4.06       86   0.75             1.34                        No      No ---------------------------------------------------------------------- Cervix Uterus Adnexa  Cervix  Not visualized (advanced GA >24wks) ---------------------------------------------------------------------- Impression  Fetal growth restriction.  On today's ultrasound, the estimated fetal weight is at the 5th  percentile.  Amniotic fluid is normal good fetal activity seen.  Umbilical artery Doppler showed normal forward diastolic flow.  I explained the findings and reassured her of normal Doppler  findings. ---------------------------------------------------------------------- Recommendations  -UA Doppler study in 2 weeks.  -Fetal growth assessment in UA Doppler study in 3 weeks. ----------------------------------------------------------------------                  Noralee Space, MD Electronically Signed Final Report    12/14/2018 05:40 pm ----------------------------------------------------------------------  [place] MFM UA CORD DOPPLER  Result Date: 01/05/2019 ----------------------------------------------------------------------  OBSTETRICS REPORT                       (Signed Final 01/05/2019 04:52 pm) ---------------------------------------------------------------------- Patient Info  ID #:       [id number]                          D.O.B.:  Sep 30, 1995 (23 yrs)  Name:       Kim Livings Solarz                     Visit Date: 01/05/2019 03:53 pm ---------------------------------------------------------------------- Performed By  Performed By:     Tommie Raymond BS,       Secondary Phy.:   [facility]                    RDMS, RVT  Attending:        Noralee Space MD        Address:          [address]                                                             [address] [phone]  [place]                                                             [address]  Referred By:      [facility]             Location:         [facility]  Ref. Address:     [address]                    [address]                    [place]                    [address] ---------------------------------------------------------------------- Orders   #  Description                          Code         Ordered By   1  [place] MFM OB FOLLOW UP                  [phone]     [facility]   2  [place] MFM UA CORD DOPPLER               [id number]     [facility]  ----------------------------------------------------------------------   #  Order #                    Accession #                 Episode #   1  [id number]                  [id number]                  [id number]   2  [id number]                  [id number]                  [id number]   ---------------------------------------------------------------------- Indications   [redacted] weeks gestation of pregnancy                Z3A.29   Maternal care for known or suspected poor      O36.5930   fetal growth, third trimester, not applicable or   unspecified IUGR   Low Risk NIPS   Encounter for other antenatal screening        Z36.2   follow-up  ---------------------------------------------------------------------- Vital Signs  Height:        5'5" ---------------------------------------------------------------------- Fetal Evaluation  Num Of Fetuses:         1  Fetal Heart Rate(bpm):  142  Cardiac Activity:       Observed  Presentation:           Cephalic  Placenta:               Posterior  P. Cord Insertion:      Previously Visualized  Amniotic Fluid  AFI FV:      Within normal limits  AFI Sum(cm)     %Tile       Largest Pocket(cm)  13.54           42          3.76  RUQ(cm)       RLQ(cm)       LUQ(cm)        LLQ(cm)  2.89          3.76          3.66           3.23 ---------------------------------------------------------------------- Biometry  BPD:      68.8  mm     G. Age:  27w 5d          2  %    CI:        71.92   %    70 - 86                                                          FL/HC:      21.2   %    19.2 - 21.4  HC:      258.2  mm     G. Age:  28w 0d        < 1  %    HC/AC:      1.09        0.99 - 1.21  AC:      236.4  mm     G. Age:  28w 0d          6  %    FL/BPD:     79.7   %    71 - 87  FL:       54.8  mm     G. Age:  29w 0d         16  %    FL/AC:      23.2   %    20 - 24  HUM:      47.5  mm     G. Age:  27w 6d         14  %  CER:      35.7  mm     G. Age:  30w 5d         64  %  LV:        3.7  mm  Est. FW:    1202  gm    2 lb 10 oz       6  % ---------------------------------------------------------------------- OB History  Gravidity:    1         Term:   0        Prem:   0  SAB:   0  TOP:          0       Ectopic:  0        Living: 0  ---------------------------------------------------------------------- Gestational Age  LMP:           29w 5d        Date:  06/11/18                 EDD:   03/18/19  U/S Today:     28w 1d                                        EDD:   03/29/19  Best:          29w 5d     Det. By:  LMP  (06/11/18)          EDD:   03/18/19 ---------------------------------------------------------------------- Anatomy  Cranium:               Appears normal         LVOT:                   Appears normal  Cavum:                 Appears normal         Aortic Arch:            Appears normal  Ventricles:            Appears normal         Ductal Arch:            Appears normal  Choroid Plexus:        Previously seen        Diaphragm:              Appears normal  Cerebellum:            Appears normal         Stomach:                Appears normal, left                                                                        sided  Posterior Fossa:       Previously seen        Abdomen:                Previously seen  Nuchal Fold:           Previously seen        Abdominal Wall:         Previously seen  Face:                  Orbits and profile     Cord Vessels:           Previously seen                         previously seen  Lips:  Previously seen        Kidneys:                Appear normal  Palate:                Previously seen        Bladder:                Appears normal  Thoracic:              Previously seen        Spine:                  Previously seen  Heart:                 Appears normal         Upper Extremities:      Previously seen                         (4CH, axis, and                         situs)  RVOT:                  Appears normal         Lower Extremities:      Previously seen  Other:  Feet/heels visualized previoiusly. Nasal bone visualized previously.          Open hands previously visualized. ---------------------------------------------------------------------- Doppler - Fetal Vessels  Umbilical  Artery   S/D     %tile     RI              PI                     ADFV    RDFV   2.6       34   0.62             0.91                        No      No ---------------------------------------------------------------------- Cervix Uterus Adnexa  Cervix  Not visualized (advanced GA >24wks)  Uterus  No abnormality visualized.  Left Ovary  Within normal limits. No adnexal mass visualized.  Right Ovary  Within normal limits. No adnexal mass visualized.  Cul De Sac  No free fluid seen.  Adnexa  No abnormality visualized. ---------------------------------------------------------------------- Impression  Fetal growth restriction.  Patient return for fetal growth  assessment and umbilical artery Doppler studies.  On ultrasound, the estimated fetal weight is at the 6th  percentile.  Adequate interval weight gain is seen (418 g).  Amniotic fluid is normal good fetal activity seen.  Umbilical  artery Doppler showed normal forward diastolic flow.  I reassured the patient of the findings. ---------------------------------------------------------------------- Recommendations  -An appointment was made for her to return in 2 weeks for  umbilical artery Doppler studies.  -Fetal growth and UA Doppler in 3 weeks. ----------------------------------------------------------------------                  Noralee Space, MD Electronically Signed Final Report   01/05/2019 04:52 pm ----------------------------------------------------------------------  [place] MFM UA CORD DOPPLER  Result Date: 12/29/2018 ----------------------------------------------------------------------  OBSTETRICS REPORT                       (  Signed Final 12/29/2018 05:02 pm) ---------------------------------------------------------------------- Patient Info  ID #:       [id number]                          D.O.B.:  Feb 17, 1995 (23 yrs)  Name:       Kim Livings Brigante                     Visit Date: 12/29/2018 04:18 pm  ---------------------------------------------------------------------- Performed By  Performed By:     Lenise Arena        Secondary Phy.:   [facility]                    RDMS  Attending:        Ma Rings MD         Address:          [address]                                                             [address]                                                             [place]                                                             [address]  Referred By:      [facility]             Location:         [facility]  Ref. Address:     [address]                    [address]                    [place]                    [address] ---------------------------------------------------------------------- Orders   #  Description                          Code         Ordered By   1  [place] MFM UA CORD DOPPLER               [phone]     [facility]  ----------------------------------------------------------------------   #  Order #                    Accession #                 Episode #   1  [id number]                  [id number]                  [id number]  ---------------------------------------------------------------------- Indications   Maternal care for known or suspected poor      O36.5930   fetal growth, third trimester, not applicable or   unspecified IUGR   Low Risk NIPS   Encounter for other antenatal screening        Z36.2   follow-up   [redacted] weeks gestation of pregnancy                Z3A.28  ---------------------------------------------------------------------- Vital Signs  Weight (lb): 190                               Height:        5'5"  BMI:         31.61 ---------------------------------------------------------------------- Fetal Evaluation  Num Of Fetuses:         1  Fetal Heart Rate(bpm):  126  Cardiac Activity:       Observed   Presentation:           Cephalic  Placenta:               Posterior  P. Cord Insertion:      Previously Visualized  Amniotic Fluid  AFI FV:      Within normal limits                              Largest Pocket(cm)                              4.94 ---------------------------------------------------------------------- OB History  Gravidity:    1         Term:   0        Prem:   0        SAB:   0  TOP:          0       Ectopic:  0        Living: 0 ---------------------------------------------------------------------- Gestational Age  LMP:           28w 5d        Date:  06/11/18                 EDD:   03/18/19  Best:          Eden Emms 5d     Det. By:  LMP  (06/11/18)          EDD:   03/18/19 ---------------------------------------------------------------------- Anatomy  Thoracic:  Appears normal         Bladder:                Appears normal  Stomach:               Appears normal, left                         sided ---------------------------------------------------------------------- Doppler - Fetal Vessels  Umbilical Artery   S/D     %tile                                            ADFV    RDFV  3.27       67                                                No      No ---------------------------------------------------------------------- Cervix Uterus Adnexa  Cervix  Not visualized (advanced GA >24wks) ---------------------------------------------------------------------- Comments  This patient was seen due to an IUGR fetus.  She denies any  problems since her last exam.  There was normal amniotic fluid noted on today's ultrasound  exam.  Doppler studies of the umbilical arteries performed due to  fetal growth restriction showed a normal S/D ratio of 3.27.  A follow-up exam was scheduled in 1 week. ----------------------------------------------------------------------                   Ma Rings, MD Electronically Signed Final Report   12/29/2018 05:02 pm  ----------------------------------------------------------------------  [place] MFM UA CORD DOPPLER  Result Date: 12/14/2018 ----------------------------------------------------------------------  OBSTETRICS REPORT                       (Signed Final 12/14/2018 05:40 pm) ---------------------------------------------------------------------- Patient Info  ID #:       [id number]                          D.O.B.:  1995-10-17 (23 yrs)  Name:       Kim Livings Soroka                     Visit Date: 12/14/2018 02:55 pm ---------------------------------------------------------------------- Performed By  Performed By:     Emeline Darling BS,      Secondary Phy.:   [facility]                    RDMS  Attending:        Noralee Space MD        Address:          [address]  [address]                                                             [place]                                                             [address]  Referred By:      [facility]             Location:         [facility]  Ref. Address:     [address]                    [address]                    [place]                    [address] ---------------------------------------------------------------------- Orders   #  Description                          Code         Ordered By   1  [place] MFM OB FOLLOW UP                  [phone]     Rosana Hoes   2  [place] MFM UA CORD DOPPLER               [id number]     YU FANG  ----------------------------------------------------------------------   #  Order #                    Accession #                 Episode #   1  [id number]                  [id number]                  [id number]   2  [id number]                  [id number]                  [id number]   ---------------------------------------------------------------------- Indications   Maternal care for known or suspected poor      O36.5920   fetal growth, second trimester, not applicable   or unspecified IUGR   Low Risk NIPS   Encounter for other antenatal screening        Z36.2  follow-up   [redacted] weeks gestation of pregnancy                Z3A.26  ---------------------------------------------------------------------- Fetal Evaluation  Num Of Fetuses:         1  Fetal Heart Rate(bpm):  146  Cardiac Activity:       Observed  Presentation:           Cephalic  Placenta:               Posterior  P. Cord Insertion:      Previously Visualized  Amniotic Fluid  AFI FV:      Within normal limits                              Largest Pocket(cm)                              4.1 ---------------------------------------------------------------------- Biometry  BPD:      59.9  mm     G. Age:  24w 3d          1  %    CI:         67.1   %    70 - 86                                                          FL/HC:      20.3   %    18.6 - 20.4  HC:      234.2  mm     G. Age:  25w 3d          4  %    HC/AC:      1.16        1.05 - 1.21  AC:      201.5  mm     G. Age:  24w 5d          4  %    FL/BPD:     79.3   %    71 - 87  FL:       47.5  mm     G. Age:  25w 6d         17  %    FL/AC:      23.6   %    20 - 24  Est. FW:     784  gm    1 lb 12 oz       5  % ---------------------------------------------------------------------- OB History  Gravidity:    1         Term:   0        Prem:   0        SAB:   0  TOP:          0       Ectopic:  0        Living: 0 ---------------------------------------------------------------------- Gestational Age  LMP:           26w 4d        Date:  06/11/18                 EDD:   03/18/19  U/S Today:  25w 1d                                        EDD:   03/28/19  Best:          26w 4d     Det. By:  LMP  (06/11/18)          EDD:   03/18/19  ---------------------------------------------------------------------- Anatomy  Cranium:               Appears normal         LVOT:                   Previously seen  Cavum:                 Previously seen        Aortic Arch:            Previously seen  Ventricles:            Appears normal         Ductal Arch:            Previously seen  Choroid Plexus:        Previously seen        Diaphragm:              Previously seen  Cerebellum:            Previously seen        Stomach:                Appears normal, left                                                                        sided  Posterior Fossa:       Previously seen        Abdomen:                Appears normal  Nuchal Fold:           Previously seen        Abdominal Wall:         Appears nml (cord                                                                        insert, abd wall)  Face:                  Orbits and profile     Cord Vessels:           Appears normal (3                         previously seen  vessel cord)  Lips:                  Appears normal         Kidneys:                Appear normal  Palate:                Previously seen        Bladder:                Appears normal  Thoracic:              Previously seen        Spine:                  Previously seen  Heart:                 Appears normal         Upper Extremities:      Previously seen                         (4CH, axis, and                         situs)  RVOT:                  Previously seen        Lower Extremities:      Previously seen  Other:  Feet/heels visualized. Nasal bone visualized. Open hands previously          visualized. Technically difficult due to fetal position. ---------------------------------------------------------------------- Doppler - Fetal Vessels  Umbilical Artery   S/D     %tile     RI              PI                     ADFV    RDFV  4.06       86   0.75             1.34                        No      No  ---------------------------------------------------------------------- Cervix Uterus Adnexa  Cervix  Not visualized (advanced GA >24wks) ---------------------------------------------------------------------- Impression  Fetal growth restriction.  On today's ultrasound, the estimated fetal weight is at the 5th  percentile.  Amniotic fluid is normal good fetal activity seen.  Umbilical artery Doppler showed normal forward diastolic flow.  I explained the findings and reassured her of normal Doppler  findings. ---------------------------------------------------------------------- Recommendations  -UA Doppler study in 2 weeks.  -Fetal growth assessment in UA Doppler study in 3 weeks. ----------------------------------------------------------------------                  Noralee Space, MD Electronically Signed Final Report   12/14/2018 05:40 pm ----------------------------------------------------------------------   Assessment and Plan:  Pregnancy: G1P0 at [redacted]w[redacted]d 1. Supervision of normal first pregnancy, antepartum  2. Iron deficiency anemia during pregnancy - taking FeSO4  3. Backache symptom Rx: - Elastic Bandages & Supports (COMFORT FIT MATERNITY SUPP SM) MISC; Wear as directed.  Dispense: 1 each; Refill: 0  Preterm labor symptoms and general obstetric precautions including but not limited to vaginal bleeding, contractions, leaking of fluid and fetal movement were reviewed in detail with the  patient. I discussed the assessment and treatment plan with the patient. The patient was provided an opportunity to ask questions and all were answered. The patient agreed with the plan and demonstrated an understanding of the instructions. The patient was advised to call back or seek an in-person office evaluation/[facility] for any urgent or concerning symptoms. Please refer to After Visit Summary for other counseling recommendations.   I provided 10 minutes of face-to-face time  during this encounter.  Return in about 2 weeks (around 01/26/2019) for MyChart.  Future Appointments  Date Time Provider Department Center  01/18/2019  3:30 PM WH-MFC US 1 WH-MFCUS MFC-US  01/18/2019  3:40 PM WH-MFC NURSE WH-MFC MFC-US  01/25/2019  3:30 PM WH-MFC US 1 WH-MFCUS MFC-US  01/25/2019  3:40 PM WH-MFC NURSE WH-MFC MFC-US    Coral Ceoharles Lennard Capek, MD Center for [facility], [facility] 01/12/2019

## 2019-01-18 ENCOUNTER — Ambulatory Visit (HOSPITAL_COMMUNITY): Payer: Medicaid Other

## 2019-01-18 ENCOUNTER — Ambulatory Visit (HOSPITAL_COMMUNITY): Payer: Medicaid Other | Attending: Obstetrics and Gynecology

## 2019-01-18 ENCOUNTER — Encounter (HOSPITAL_COMMUNITY): Payer: Self-pay

## 2019-01-25 ENCOUNTER — Ambulatory Visit (HOSPITAL_COMMUNITY)
Admission: RE | Admit: 2019-01-25 | Discharge: 2019-01-25 | Disposition: A | Discharge status: Home / Self Care | Payer: Medicaid Other | Source: Ambulatory Visit | Attending: Obstetrics and Gynecology | Admitting: Obstetrics and Gynecology

## 2019-01-25 ENCOUNTER — Ambulatory Visit (HOSPITAL_COMMUNITY): Payer: Medicaid Other | Admitting: *Deleted

## 2019-01-25 ENCOUNTER — Other Ambulatory Visit (HOSPITAL_COMMUNITY): Payer: Self-pay | Admitting: Obstetrics and Gynecology

## 2019-01-25 ENCOUNTER — Encounter (HOSPITAL_COMMUNITY): Payer: Self-pay | Admitting: *Deleted

## 2019-01-25 ENCOUNTER — Other Ambulatory Visit: Payer: Self-pay

## 2019-01-25 DIAGNOSIS — O365930 Maternal care for other known or suspected poor fetal growth, third trimester, not applicable or unspecified: Secondary | ICD-10-CM

## 2019-01-25 DIAGNOSIS — O99013 Anemia complicating pregnancy, third trimester: Secondary | ICD-10-CM | POA: Diagnosis not present

## 2019-01-25 DIAGNOSIS — D509 Iron deficiency anemia, unspecified: Secondary | ICD-10-CM | POA: Insufficient documentation

## 2019-01-25 DIAGNOSIS — Z3A32 32 weeks gestation of pregnancy: Secondary | ICD-10-CM | POA: Diagnosis not present

## 2019-01-25 DIAGNOSIS — O365990 Maternal care for other known or suspected poor fetal growth, unspecified trimester, not applicable or unspecified: Secondary | ICD-10-CM

## 2019-01-25 DIAGNOSIS — O0932 Supervision of pregnancy with insufficient antenatal care, second trimester: Secondary | ICD-10-CM

## 2019-01-25 DIAGNOSIS — O99019 Anemia complicating pregnancy, unspecified trimester: Secondary | ICD-10-CM | POA: Diagnosis present

## 2019-01-25 DIAGNOSIS — Z362 Encounter for other antenatal screening follow-up: Secondary | ICD-10-CM

## 2019-01-26 ENCOUNTER — Other Ambulatory Visit (HOSPITAL_COMMUNITY): Payer: Self-pay | Admitting: *Deleted

## 2019-01-26 DIAGNOSIS — O365930 Maternal care for other known or suspected poor fetal growth, third trimester, not applicable or unspecified: Secondary | ICD-10-CM

## 2019-01-28 NOTE — L&D Delivery Note (Addendum)
OB/GYN Faculty Practice Delivery Note  Kim Fritz is a 24 y.o. G1P0 at [redacted]w[redacted]d. She was admitted for IOL d/t IUGR <3%.   ROM: 16h 38m with clear fluid GBS Status: Positive, adequate ppx Maximum Maternal Temperature: 99.5*F  Labor Progress: Patient was admitted and her induction was started with a foley balloon and low dose pitocin. She was eventually AROMd, and progressed to complete on pitocin.  Delivery Date/Time: 02/25/19 at 1719 Delivery: Called to room and patient was complete and pushing. Head delivered Right Occipital Anterior. No nuchal cord present. Shoulder and body delivered in usual fashion. Infant with spontaneous cry, placed on mother's abdomen, dried and stimulated. Cord clamped x 2 after 1-minute delay, and cut by FOB. Cord blood drawn. Placenta delivered spontaneously with gentle cord traction. Fundus firm with massage and Pitocin. Labia, perineum, vagina, and cervix inspected with hemostatic bilateral labial abrasions, and a hemostatic periurethral abrasion. Post-placental IUD placed by Dr. Salomon Mast, see procedure note for details.  Placenta: 3VC, intact with what appeared to be a large blood clot, to pathology Complications: none Lacerations: none EBL: 155 mL Analgesia: epidural  Postpartum Planning [x]  message to sent to schedule follow-up  [x]  vaccines UTD  Infant: female  APGARs 9,9  weight pending  , MD [facility], PGY-1 02/25/2019, 5:34 PM  GME ATTESTATION:  I saw and evaluated the patient. I was gowned and gloved for the entire delivery. I personally placed the patient's post placental IUD- see separate note for details. I agree with the findings and the plan of care as documented in the resident's note.  [facility], DO OB Fellow, Faculty [facility], Center for [facility] 02/25/2019 5:47 PM

## 2019-02-01 ENCOUNTER — Ambulatory Visit (HOSPITAL_COMMUNITY): Payer: Medicaid Other | Attending: Obstetrics and Gynecology

## 2019-02-08 ENCOUNTER — Other Ambulatory Visit: Payer: Self-pay

## 2019-02-08 ENCOUNTER — Ambulatory Visit (HOSPITAL_COMMUNITY): Payer: Medicaid Other | Admitting: *Deleted

## 2019-02-08 ENCOUNTER — Other Ambulatory Visit (HOSPITAL_COMMUNITY): Payer: Self-pay | Admitting: *Deleted

## 2019-02-08 ENCOUNTER — Ambulatory Visit (HOSPITAL_COMMUNITY)
Admission: RE | Admit: 2019-02-08 | Discharge: 2019-02-08 | Disposition: A | Discharge status: Home / Self Care | Payer: Medicaid Other | Source: Ambulatory Visit | Attending: Obstetrics and Gynecology | Admitting: Obstetrics and Gynecology

## 2019-02-08 DIAGNOSIS — O365990 Maternal care for other known or suspected poor fetal growth, unspecified trimester, not applicable or unspecified: Secondary | ICD-10-CM | POA: Diagnosis present

## 2019-02-08 DIAGNOSIS — O0932 Supervision of pregnancy with insufficient antenatal care, second trimester: Secondary | ICD-10-CM

## 2019-02-08 DIAGNOSIS — D509 Iron deficiency anemia, unspecified: Secondary | ICD-10-CM | POA: Insufficient documentation

## 2019-02-08 DIAGNOSIS — O99013 Anemia complicating pregnancy, third trimester: Secondary | ICD-10-CM | POA: Diagnosis not present

## 2019-02-08 DIAGNOSIS — O365930 Maternal care for other known or suspected poor fetal growth, third trimester, not applicable or unspecified: Secondary | ICD-10-CM | POA: Insufficient documentation

## 2019-02-08 DIAGNOSIS — Z3A34 34 weeks gestation of pregnancy: Secondary | ICD-10-CM | POA: Diagnosis not present

## 2019-02-08 DIAGNOSIS — O99019 Anemia complicating pregnancy, unspecified trimester: Secondary | ICD-10-CM

## 2019-02-15 ENCOUNTER — Ambulatory Visit (HOSPITAL_COMMUNITY): Payer: Medicaid Other | Admitting: *Deleted

## 2019-02-15 ENCOUNTER — Other Ambulatory Visit: Payer: Self-pay

## 2019-02-15 ENCOUNTER — Ambulatory Visit (HOSPITAL_COMMUNITY)
Admission: RE | Admit: 2019-02-15 | Discharge: 2019-02-15 | Disposition: A | Discharge status: Home / Self Care | Payer: Medicaid Other | Source: Ambulatory Visit | Attending: Obstetrics and Gynecology | Admitting: Obstetrics and Gynecology

## 2019-02-15 DIAGNOSIS — O0932 Supervision of pregnancy with insufficient antenatal care, second trimester: Secondary | ICD-10-CM

## 2019-02-15 DIAGNOSIS — D509 Iron deficiency anemia, unspecified: Secondary | ICD-10-CM

## 2019-02-15 DIAGNOSIS — O365930 Maternal care for other known or suspected poor fetal growth, third trimester, not applicable or unspecified: Secondary | ICD-10-CM | POA: Diagnosis present

## 2019-02-15 DIAGNOSIS — O365931 Maternal care for other known or suspected poor fetal growth, third trimester, fetus 1: Secondary | ICD-10-CM

## 2019-02-15 DIAGNOSIS — O99019 Anemia complicating pregnancy, unspecified trimester: Secondary | ICD-10-CM

## 2019-02-15 DIAGNOSIS — Z362 Encounter for other antenatal screening follow-up: Secondary | ICD-10-CM

## 2019-02-15 DIAGNOSIS — Z3A35 35 weeks gestation of pregnancy: Secondary | ICD-10-CM | POA: Diagnosis not present

## 2019-02-22 ENCOUNTER — Encounter (HOSPITAL_COMMUNITY): Payer: Self-pay | Admitting: *Deleted

## 2019-02-22 ENCOUNTER — Ambulatory Visit (HOSPITAL_COMMUNITY): Payer: Medicaid Other | Admitting: *Deleted

## 2019-02-22 ENCOUNTER — Other Ambulatory Visit: Payer: Self-pay

## 2019-02-22 ENCOUNTER — Ambulatory Visit (HOSPITAL_COMMUNITY)
Admission: RE | Admit: 2019-02-22 | Discharge: 2019-02-22 | Disposition: A | Discharge status: Home / Self Care | Payer: Medicaid Other | Source: Ambulatory Visit | Attending: Obstetrics and Gynecology | Admitting: Obstetrics and Gynecology

## 2019-02-22 ENCOUNTER — Encounter: Payer: Self-pay | Admitting: Certified Nurse Midwife

## 2019-02-22 DIAGNOSIS — D509 Iron deficiency anemia, unspecified: Secondary | ICD-10-CM

## 2019-02-22 DIAGNOSIS — O99019 Anemia complicating pregnancy, unspecified trimester: Secondary | ICD-10-CM | POA: Insufficient documentation

## 2019-02-22 DIAGNOSIS — O0932 Supervision of pregnancy with insufficient antenatal care, second trimester: Secondary | ICD-10-CM | POA: Insufficient documentation

## 2019-02-22 DIAGNOSIS — Z3A35 35 weeks gestation of pregnancy: Secondary | ICD-10-CM | POA: Diagnosis not present

## 2019-02-22 DIAGNOSIS — O365930 Maternal care for other known or suspected poor fetal growth, third trimester, not applicable or unspecified: Secondary | ICD-10-CM | POA: Diagnosis present

## 2019-02-23 ENCOUNTER — Other Ambulatory Visit: Payer: Self-pay | Admitting: Certified Nurse Midwife

## 2019-02-23 ENCOUNTER — Other Ambulatory Visit: Payer: Self-pay | Admitting: Obstetrics

## 2019-02-23 ENCOUNTER — Other Ambulatory Visit: Payer: Self-pay | Admitting: Advanced Practice Midwife

## 2019-02-23 DIAGNOSIS — N760 Acute vaginitis: Secondary | ICD-10-CM

## 2019-02-23 DIAGNOSIS — B9689 Other specified bacterial agents as the cause of diseases classified elsewhere: Secondary | ICD-10-CM

## 2019-02-23 MED ORDER — SOLOSEC 2 G PO PACK: 1.0000 | PACK | Freq: Once | ORAL | 2 refills | Status: AC

## 2019-02-24 ENCOUNTER — Inpatient Hospital Stay (HOSPITAL_COMMUNITY): Payer: Medicaid Other

## 2019-02-24 ENCOUNTER — Other Ambulatory Visit: Payer: Self-pay

## 2019-02-24 ENCOUNTER — Inpatient Hospital Stay (HOSPITAL_COMMUNITY)
Admission: AD | Admit: 2019-02-24 | Discharge: 2019-02-27 | DRG: 807 | Disposition: A | Discharge status: Home / Self Care | Payer: Medicaid Other | Attending: Obstetrics and Gynecology | Admitting: Obstetrics and Gynecology

## 2019-02-24 ENCOUNTER — Encounter (HOSPITAL_COMMUNITY): Payer: Self-pay | Admitting: Obstetrics and Gynecology

## 2019-02-24 DIAGNOSIS — Z3A37 37 weeks gestation of pregnancy: Secondary | ICD-10-CM | POA: Diagnosis not present

## 2019-02-24 DIAGNOSIS — Z3403 Encounter for supervision of normal first pregnancy, third trimester: Secondary | ICD-10-CM

## 2019-02-24 DIAGNOSIS — Z880 Allergy status to penicillin: Secondary | ICD-10-CM

## 2019-02-24 DIAGNOSIS — Z789 Other specified health status: Secondary | ICD-10-CM

## 2019-02-24 DIAGNOSIS — Z20822 Contact with and (suspected) exposure to covid-19: Secondary | ICD-10-CM | POA: Diagnosis present

## 2019-02-24 DIAGNOSIS — O0930 Supervision of pregnancy with insufficient antenatal care, unspecified trimester: Secondary | ICD-10-CM

## 2019-02-24 DIAGNOSIS — Z30430 Encounter for insertion of intrauterine contraceptive device: Secondary | ICD-10-CM

## 2019-02-24 DIAGNOSIS — O361930 Maternal care for other isoimmunization, third trimester, not applicable or unspecified: Secondary | ICD-10-CM | POA: Diagnosis present

## 2019-02-24 DIAGNOSIS — D509 Iron deficiency anemia, unspecified: Secondary | ICD-10-CM | POA: Diagnosis present

## 2019-02-24 DIAGNOSIS — Z87891 Personal history of nicotine dependence: Secondary | ICD-10-CM | POA: Diagnosis not present

## 2019-02-24 DIAGNOSIS — O9902 Anemia complicating childbirth: Secondary | ICD-10-CM | POA: Diagnosis present

## 2019-02-24 DIAGNOSIS — Z3A36 36 weeks gestation of pregnancy: Secondary | ICD-10-CM | POA: Diagnosis not present

## 2019-02-24 DIAGNOSIS — O99824 Streptococcus B carrier state complicating childbirth: Secondary | ICD-10-CM | POA: Diagnosis present

## 2019-02-24 DIAGNOSIS — Z8659 Personal history of other mental and behavioral disorders: Secondary | ICD-10-CM

## 2019-02-24 DIAGNOSIS — O365930 Maternal care for other known or suspected poor fetal growth, third trimester, not applicable or unspecified: Principal | ICD-10-CM | POA: Diagnosis present

## 2019-02-24 DIAGNOSIS — O99019 Anemia complicating pregnancy, unspecified trimester: Secondary | ICD-10-CM | POA: Diagnosis present

## 2019-02-24 DIAGNOSIS — R8271 Bacteriuria: Secondary | ICD-10-CM | POA: Diagnosis present

## 2019-02-24 DIAGNOSIS — O361990 Maternal care for other isoimmunization, unspecified trimester, not applicable or unspecified: Secondary | ICD-10-CM | POA: Diagnosis present

## 2019-02-24 LAB — SARS CORONAVIRUS 2 (TAT 6-24 HRS): SARS Coronavirus 2: NEGATIVE

## 2019-02-24 LAB — CBC
HCT: 27.5 % — ABNORMAL LOW (ref 36.0–46.0)
Hemoglobin: 8.9 g/dL — ABNORMAL LOW (ref 12.0–15.0)
MCH: 32.1 pg (ref 26.0–34.0)
MCHC: 32.4 g/dL (ref 30.0–36.0)
MCV: 99.3 fL (ref 80.0–100.0)
Platelets: 496 10*3/uL — ABNORMAL HIGH (ref 150–400)
RBC: 2.77 MIL/uL — ABNORMAL LOW (ref 3.87–5.11)
RDW: 12.7 % (ref 11.5–15.5)
WBC: 8.8 10*3/uL (ref 4.0–10.5)
nRBC: 0 % (ref 0.0–0.2)

## 2019-02-24 LAB — PREPARE RBC (CROSSMATCH)

## 2019-02-24 LAB — RPR: RPR Ser Ql: NONREACTIVE

## 2019-02-24 MED ORDER — OXYTOCIN 40 UNITS IN NORMAL SALINE INFUSION - SIMPLE MED
1.0000 m[IU]/min | INTRAVENOUS | Status: DC
  Administered 2019-02-24: 2 m[IU]/min via INTRAVENOUS

## 2019-02-24 MED ORDER — VANCOMYCIN HCL IN DEXTROSE 1-5 GM/200ML-% IV SOLN
1000.0000 mg | Freq: Two times a day (BID) | INTRAVENOUS | Status: DC
  Administered 2019-02-24 – 2019-02-25 (×3): 1000 mg via INTRAVENOUS
  Filled 2019-02-24 (×3): qty 200

## 2019-02-24 MED ORDER — ONDANSETRON HCL 4 MG/2ML IJ SOLN: 4.0000 mg | Freq: Four times a day (QID) | INTRAMUSCULAR | Status: DC | PRN

## 2019-02-24 MED ORDER — LIDOCAINE HCL (PF) 1 % IJ SOLN: 30.0000 mL | INTRAMUSCULAR | Status: DC | PRN

## 2019-02-24 MED ORDER — OXYCODONE-ACETAMINOPHEN 5-325 MG PO TABS: 2.0000 | ORAL_TABLET | ORAL | Status: DC | PRN

## 2019-02-24 MED ORDER — BETAMETHASONE SOD PHOS & ACET 6 (3-3) MG/ML IJ SUSP
12.0000 mg | INTRAMUSCULAR | Status: DC
  Administered 2019-02-24: 12 mg via INTRAMUSCULAR
  Filled 2019-02-24: qty 5

## 2019-02-24 MED ORDER — FENTANYL CITRATE (PF) 100 MCG/2ML IJ SOLN
100.0000 ug | INTRAMUSCULAR | Status: DC | PRN
  Administered 2019-02-24 – 2019-02-25 (×5): 100 ug via INTRAVENOUS
  Filled 2019-02-24 (×5): qty 2

## 2019-02-24 MED ORDER — OXYTOCIN BOLUS FROM INFUSION
500.0000 mL | Freq: Once | INTRAVENOUS | Status: AC
  Administered 2019-02-25: 500 mL via INTRAVENOUS

## 2019-02-24 MED ORDER — SOD CITRATE-CITRIC ACID 500-334 MG/5ML PO SOLN: 30.0000 mL | ORAL | Status: DC | PRN

## 2019-02-24 MED ORDER — OXYCODONE-ACETAMINOPHEN 5-325 MG PO TABS: 1.0000 | ORAL_TABLET | ORAL | Status: DC | PRN

## 2019-02-24 MED ORDER — TERBUTALINE SULFATE 1 MG/ML IJ SOLN: 0.2500 mg | Freq: Once | INTRAMUSCULAR | Status: DC | PRN

## 2019-02-24 MED ORDER — LACTATED RINGERS IV SOLN
500.0000 mL | INTRAVENOUS | Status: DC | PRN
  Administered 2019-02-25: 500 mL via INTRAVENOUS

## 2019-02-24 MED ORDER — ACETAMINOPHEN 325 MG PO TABS: 650.0000 mg | ORAL_TABLET | ORAL | Status: DC | PRN

## 2019-02-24 MED ORDER — MISOPROSTOL 25 MCG QUARTER TABLET: 25.0000 ug | ORAL_TABLET | ORAL | Status: DC | PRN

## 2019-02-24 MED ORDER — LEVONORGESTREL 19.5 MCG/DAY IU IUD
INTRAUTERINE_SYSTEM | Freq: Once | INTRAUTERINE | Status: AC
  Administered 2019-02-25: 1 via INTRAUTERINE
  Filled 2019-02-24: qty 1

## 2019-02-24 MED ORDER — LACTATED RINGERS IV SOLN: INTRAVENOUS | Status: DC

## 2019-02-24 MED ORDER — SODIUM CHLORIDE 0.9% IV SOLUTION: Freq: Once | INTRAVENOUS | Status: DC

## 2019-02-24 MED ORDER — OXYTOCIN 40 UNITS IN NORMAL SALINE INFUSION - SIMPLE MED
2.5000 [IU]/h | INTRAVENOUS | Status: DC
  Filled 2019-02-24: qty 1000

## 2019-02-24 NOTE — Progress Notes (Signed)
Labor Progress Note Kim Fritz is a 24 y.o. G1P0 at [redacted]w[redacted]d presented for IOL d/t severe IUGR S: patient comfortable  O:  BP 116/76 (BP Location: Left Arm)   Pulse 76   Temp 98.4 F (36.9 C) (Oral)   Resp 16   Ht 5\' 5"  (1.651 m)   Wt 85.3 kg   LMP 06/11/2018 (Exact Date)   BMI 31.28 kg/m  EFM: 130 bpm/+accels/-decels  CVE: Dilation: 1 Effacement (%): Thick Station: -3 Exam by:: [email or web address], CNM   A&P: 24 y.o. G1P0 [redacted]w[redacted]d here for IOL d/t IUGR #Labor: Cook catheter placed with speculum, 80cc. Low dose pitocin started, hold at 72mu/min until FB out. #Pain: Per patient request #FWB: Cat I #GBS positive, Vanc   11m, MD 10:56 AM

## 2019-02-24 NOTE — Progress Notes (Signed)
Labor Progress Note Kim Fritz is a 24 y.o. G1P0 at [redacted]w[redacted]d presented for IOL d/t severe IUGR S: patient very uncomfortable, pain is 9/10  O:  BP 119/77   Pulse 68   Temp 98.4 F (36.9 C) (Oral)   Resp 16   Ht 5\' 5"  (1.651 m)   Wt 85.3 kg   LMP 06/11/2018 (Exact Date)   BMI 31.28 kg/m  EFM: 125 bpm/+accels/-decels  CVE: Dilation: 1 Effacement (%): Thick Station: -3 Exam by:: [email or web address], CNM   A&P: 24 y.o. G1P0 [redacted]w[redacted]d here for IOL d/t IUGR #Labor: FB still in. Pitocin at 70mu/min. Traction on FB placed. #Pain: Per patient request. Third dose of fentanyl given now. #FWB: Cat I #GBS positive, Vanc   11m, MD 4:40 PM

## 2019-02-24 NOTE — Progress Notes (Signed)
Labor Progress Note Kim Fritz is a 24 y.o. G1P0 at 76w6dpresented for IOL for FGR. S: Met patient and discussed plan. More comfortable with FB out.  O:  BP 117/64   Pulse 65   Temp 97.8 F (36.6 C) (Oral)   Resp 14   Ht 5' 5"  (1.651 m)   Wt 85.3 kg   LMP 06/11/2018 (Exact Date)   BMI 31.28 kg/m  EFM: 145, moderate variability, pos accels, no decels, reactive TOCO: q2-538mCVE: Dilation: 4 Effacement (%): 70 Station: -2 Presentation: Vertex Exam by:: C Remus BlakeN   A&P: 89.o. G1P0 3666w6dre for FGR IOL. #Labor: S/p 1 dose of BMZ; second dose not indicated as will have 37w. S/p FB. Currently on Pitocin. Cont Pit titration. Consider AROM when further dilated. Anticipate SVD. #Pain: per patient request #FWB: Cat I #GBSuria; Vanc  #Anemia: Anti-M; 2 units on hold. Hgb 9.5  CheChauncey MannD 8:29 PM

## 2019-02-24 NOTE — H&P (Addendum)
OBSTETRIC ADMISSION HISTORY AND PHYSICAL  Kim Fritz is a 24 y.o. female G1P0 with IUP at 70w6dby LMP presenting for IOL d/t severe IUGR, MFM recommended delivery by 37w. She reports +FMs, No LOF, no VB, no blurry vision, headaches or peripheral edema, and RUQ pain.  She plans on breast feeding. She requests PP IUD for birth control. 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 ADHD (attention deficit hyperactivity disorder)   . Anemia   . Asthma   . Headache   . Insomnia   . Pre-diabetes     Past Surgical History: History reviewed. No pertinent surgical history.  Obstetrical History: OB History    Gravida  1   Para  0   Term      Preterm      AB      Living  0     SAB      TAB      Ectopic      Multiple      Live Births              Social History: Social History   Socioeconomic History  . Marital status: Significant Other    Spouse name: Not on file  . Number of children: Not on file  . Years of education: Not on file  . Highest education level: Not on file  Occupational History  . Occupation: [employer] Tobacco Use  . Smoking status: Former Smoker    Packs/day: 0.15    Types: Cigarettes    Quit date: 07/25/2018    Years since quitting: 0.5  . Smokeless tobacco: Never Used  Substance and Sexual Activity  . Alcohol use: No  . Drug  use: No  . Sexual activity: Yes    Partners: Male  Other Topics Concern  . Not on file  Social History Narrative  . Not on file   Social Determinants of Health   Financial Resource Strain:   . Difficulty of Paying Living Expenses: Not on file  Food Insecurity:   . Worried About RCharity fundraiserin the Last Year: Not on file  . Ran Out of Food in the Last Year: Not on file  Transportation Needs:   . Lack of Transportation (Medical): Not on file  . Lack of Transportation (Non-Medical): Not on file  Physical Activity:   . Days of Exercise per Week: Not on file  . Minutes of Exercise per Session: Not on file  Stress:   . Feeling of Stress : Not on file  Social Connections:   . Frequency of Communication with Friends and Family: Not on file  . Frequency of Social Gatherings with Friends and Family: Not on file  . Attends Religious Services: Not on file  . Active Member of Clubs or Organizations: Not on file  . Attends CArchivistMeetings: Not on file  . Marital Status: Not on file  Family History: Family History  Problem Relation Age of Onset  . ADD / ADHD Mother   . Alcohol abuse Mother   . Alcohol abuse Father   . Hypertension Sister   . ADD / ADHD Sister   . Asthma Maternal Aunt   . Cancer Maternal Aunt   . ADD / ADHD Maternal Grandmother   . Vision loss Maternal Grandmother     Allergies: Allergies  Allergen Reactions  . Penicillins Hives    With itching    Medications Prior to Admission  Medication Sig Dispense Refill Last Dose  . Prenatal Vit-Fe Fumarate-FA (PRENATAL VITAMINS) 28-0.8 MG TABS Take by mouth.   02/24/2019 at Unknown time  . albuterol (PROVENTIL HFA;VENTOLIN HFA) 108 (90 BASE) MCG/ACT inhaler Inhale 1-2 puffs into the lungs every 6 (six) hours as needed for wheezing or shortness of breath. 1 Inhaler 0   . Blood Pressure Monitor KIT 1 kit by Does not apply route once a week. Check BP Weekly. Large Cuff DX O90.0 1 kit 0   . Blood  Pressure Monitoring (BLOOD PRESSURE KIT) DEVI 1 kit by Does not apply route once a week. Check BP Weekly.  Large Cuff.  DX: Z13.6  Z34.86  O09.0 1 kit 0   . Elastic Bandages & Supports (COMFORT FIT MATERNITY SUPP SM) MISC Wear as directed. 1 each 0   . ferrous sulfate (FERROUSUL) 325 (65 FE) MG tablet Take 1 tablet (325 mg total) by mouth 2 (two) times daily. (Patient not taking: Reported on 01/05/2019) 60 tablet 2      Review of Systems   All systems reviewed and negative except as stated in HPI  Blood pressure 116/76, pulse 76, temperature 98.4 F (36.9 C), temperature source Oral, resp. rate 16, height 5' 5"  (1.651 m), weight 86.7 kg, last menstrual period 06/11/2018. General appearance: alert, cooperative, appears stated age and no distress Lungs: normal effort Heart: regular rate  Abdomen: soft, non-tender; bowel sounds normal Pelvic: gravid uterus GU: No vaginal lesions  Extremities: Homans sign is negative, no sign of DVT DTR's intact Presentation: cephalic Fetal monitoringBaseline: 130 bpm, Variability: Good {> 6 bpm), Accelerations: Reactive and Decelerations: Absent Uterine activity: None    Prenatal labs: ABO, Rh: O/Positive/-- (09/16 1111) Antibody: Positive, See Final Results (09/16 1111) Anti M Rubella: 1.62 (09/16 1111) RPR: Non Reactive (12/03 1038)  HBsAg: Negative (09/16 1111)  HIV: Non Reactive (12/03 1038)  GBS:   Bacteriuria positive 2 hr Glucola 93 Genetic screening  NIPS low risk, AFP negative Anatomy US WNL  Prenatal Transfer Tool  Maternal Diabetes: No Genetic Screening: Normal Maternal Ultrasounds/Referrals: IUGR Fetal Ultrasounds or other Referrals:  Referred to Materal Fetal Medicine  Maternal Substance Abuse:  No Significant Maternal Medications:  None Significant Maternal Lab Results: Group B Strep positive  No results found for this or any previous visit (from the past 24 hour(s)).  Patient Active Problem List   Diagnosis Date Noted  .  Intrauterine growth restriction (IUGR) affecting care of mother, third trimester, single gestation 02/24/2019  . Iron deficiency anemia during pregnancy 01/03/2019  . Late prenatal care affecting pregnancy in second trimester 11/15/2018  . Trichomonal vaginitis during pregnancy 11/10/2018  . Anti-M isoimmunization affecting pregnancy, antepartum 10/21/2018  . GBS bacteriuria 10/21/2018  . Maternal varicella, non-immune 10/21/2018  . History of major depression 10/13/2018  . Supervision of normal first pregnancy 09/24/2018    Assessment/Plan:  Mersadez Linden is a 24 y.o. G1P0 at 60w6dhere for IOL d/t severe IUGR.  #  Labor: Risks and benefits of induction were reviewed, including failure of method, prolonged labor, need for further intervention, risk of cesarean.  Patient seemed to understand these risks and wishes to proceed. Options of cytotech, foley bulb, AROM, and pitocin reviewed, with use of each discussed. Plan to begin induction w/ FB and low dose pitocin. Admission labs pending.  #Severe IUGR: IOL by 37w. BPP and dopplers WNL. Cat I on EFM so far. BMZ given today. #Anti-M isoimmunization: T&C pending. #Varicella Non-immune: vaccine to be given postpartum. #Pain: Per patient request #FWB: Cat I; EFW: 2500g #ID:  GBS positive, PCN allergy, Vanc ordered #MOF: breast #MOC: IUD #Circ:  n/a  Gladys Damme, MD [place] [facility], PGY-1 02/24/2019, 8:52 AM  I confirm that I have verified the information documented in the resident's note and that I have also personally reperformed the history, physical exam and all medical decision making activities of this service and have verified that all service and findings are accurately documented in this student's note.   Cook FB placed using speculum by CNM, plan for low dose pitocin until FB out.   Lajean Manes, CNM 02/24/2019 11:06 AM

## 2019-02-24 NOTE — Progress Notes (Signed)
Patient arrived to unit from home room 201.

## 2019-02-24 NOTE — Plan of Care (Signed)
  Problem: Education: Goal: Knowledge of Childbirth will improve Outcome: Progressing Goal: Ability to make informed decisions regarding treatment and plan of care will improve Outcome: Progressing Goal: Ability to state and carry out methods to decrease the pain will improve Outcome: Progressing Goal: Individualized Educational Video(s) Outcome: Progressing   Problem: Coping: Goal: Ability to verbalize concerns and feelings about labor and delivery will improve Outcome: Progressing   Problem: Life Cycle: Goal: Ability to make normal progression through stages of labor will improve Outcome: Progressing   

## 2019-02-25 ENCOUNTER — Inpatient Hospital Stay (HOSPITAL_COMMUNITY): Payer: Medicaid Other | Admitting: Anesthesiology

## 2019-02-25 ENCOUNTER — Encounter (HOSPITAL_COMMUNITY): Payer: Self-pay | Admitting: Obstetrics and Gynecology

## 2019-02-25 DIAGNOSIS — Z30430 Encounter for insertion of intrauterine contraceptive device: Secondary | ICD-10-CM

## 2019-02-25 DIAGNOSIS — O365930 Maternal care for other known or suspected poor fetal growth, third trimester, not applicable or unspecified: Secondary | ICD-10-CM

## 2019-02-25 DIAGNOSIS — O99824 Streptococcus B carrier state complicating childbirth: Secondary | ICD-10-CM

## 2019-02-25 DIAGNOSIS — Z3A37 37 weeks gestation of pregnancy: Secondary | ICD-10-CM

## 2019-02-25 LAB — GC/CHLAMYDIA PROBE AMP (~~LOC~~) NOT AT ARMC
Chlamydia: NEGATIVE
Comment: NEGATIVE
Comment: NORMAL
Neisseria Gonorrhea: NEGATIVE

## 2019-02-25 MED ORDER — PRENATAL MULTIVITAMIN CH
1.0000 | ORAL_TABLET | Freq: Every day | ORAL | Status: DC
  Administered 2019-02-26: 1 via ORAL
  Filled 2019-02-25: qty 1

## 2019-02-25 MED ORDER — DIPHENHYDRAMINE HCL 25 MG PO CAPS: 25.0000 mg | ORAL_CAPSULE | Freq: Four times a day (QID) | ORAL | Status: DC | PRN

## 2019-02-25 MED ORDER — LIDOCAINE-EPINEPHRINE (PF) 2 %-1:200000 IJ SOLN
INTRAMUSCULAR | Status: DC | PRN
  Administered 2019-02-25 (×2): 2 mL via EPIDURAL

## 2019-02-25 MED ORDER — DIPHENHYDRAMINE HCL 50 MG/ML IJ SOLN: 12.5000 mg | INTRAMUSCULAR | Status: DC | PRN

## 2019-02-25 MED ORDER — BENZOCAINE-MENTHOL 20-0.5 % EX AERO: 1.0000 "application " | INHALATION_SPRAY | CUTANEOUS | Status: DC | PRN

## 2019-02-25 MED ORDER — EPHEDRINE 5 MG/ML INJ: 10.0000 mg | INTRAVENOUS | Status: DC | PRN

## 2019-02-25 MED ORDER — ZOLPIDEM TARTRATE 5 MG PO TABS: 5.0000 mg | ORAL_TABLET | Freq: Every evening | ORAL | Status: DC | PRN

## 2019-02-25 MED ORDER — PHENYLEPHRINE 40 MCG/ML (10ML) SYRINGE FOR IV PUSH (FOR BLOOD PRESSURE SUPPORT)
80.0000 ug | PREFILLED_SYRINGE | INTRAVENOUS | Status: DC | PRN
  Administered 2019-02-25: 80 ug via INTRAVENOUS
  Filled 2019-02-25: qty 10

## 2019-02-25 MED ORDER — SODIUM CHLORIDE (PF) 0.9 % IJ SOLN
INTRAMUSCULAR | Status: DC | PRN
  Administered 2019-02-25: 12 mL/h via EPIDURAL

## 2019-02-25 MED ORDER — SENNOSIDES-DOCUSATE SODIUM 8.6-50 MG PO TABS
2.0000 | ORAL_TABLET | ORAL | Status: DC
  Administered 2019-02-26: 2 via ORAL
  Filled 2019-02-25 (×2): qty 2

## 2019-02-25 MED ORDER — PHENYLEPHRINE 40 MCG/ML (10ML) SYRINGE FOR IV PUSH (FOR BLOOD PRESSURE SUPPORT)
80.0000 ug | PREFILLED_SYRINGE | INTRAVENOUS | Status: DC | PRN
  Administered 2019-02-25: 80 ug via INTRAVENOUS

## 2019-02-25 MED ORDER — SIMETHICONE 80 MG PO CHEW: 80.0000 mg | CHEWABLE_TABLET | ORAL | Status: DC | PRN

## 2019-02-25 MED ORDER — ONDANSETRON HCL 4 MG/2ML IJ SOLN: 4.0000 mg | INTRAMUSCULAR | Status: DC | PRN

## 2019-02-25 MED ORDER — LACTATED RINGERS IV SOLN
500.0000 mL | Freq: Once | INTRAVENOUS | Status: AC
  Administered 2019-02-25: 500 mL via INTRAVENOUS

## 2019-02-25 MED ORDER — WITCH HAZEL-GLYCERIN EX PADS: 1.0000 "application " | MEDICATED_PAD | CUTANEOUS | Status: DC | PRN

## 2019-02-25 MED ORDER — IBUPROFEN 600 MG PO TABS
600.0000 mg | ORAL_TABLET | Freq: Four times a day (QID) | ORAL | Status: DC
  Administered 2019-02-26 – 2019-02-27 (×4): 600 mg via ORAL
  Filled 2019-02-25 (×6): qty 1

## 2019-02-25 MED ORDER — FENTANYL-BUPIVACAINE-NACL 0.5-0.125-0.9 MG/250ML-% EP SOLN
12.0000 mL/h | EPIDURAL | Status: DC | PRN
  Filled 2019-02-25: qty 250

## 2019-02-25 MED ORDER — ONDANSETRON HCL 4 MG PO TABS: 4.0000 mg | ORAL_TABLET | ORAL | Status: DC | PRN

## 2019-02-25 MED ORDER — ACETAMINOPHEN 325 MG PO TABS
650.0000 mg | ORAL_TABLET | ORAL | Status: DC | PRN
  Administered 2019-02-26: 650 mg via ORAL
  Filled 2019-02-25: qty 2

## 2019-02-25 MED ORDER — DIBUCAINE (PERIANAL) 1 % EX OINT: 1.0000 "application " | TOPICAL_OINTMENT | CUTANEOUS | Status: DC | PRN

## 2019-02-25 MED ORDER — TETANUS-DIPHTH-ACELL PERTUSSIS 5-2.5-18.5 LF-MCG/0.5 IM SUSP: 0.5000 mL | Freq: Once | INTRAMUSCULAR | Status: DC

## 2019-02-25 MED ORDER — COCONUT OIL OIL
1.0000 "application " | TOPICAL_OIL | Status: DC | PRN
  Administered 2019-02-26: 1 via TOPICAL

## 2019-02-25 NOTE — Progress Notes (Addendum)
LABOR PROGRESS NOTE  Kim Fritz is a 24 y.o. G1P0 at [redacted]w[redacted]d  admitted for IOL for FGR  Subjective: Patient doing well, comfortable since epidural.  Objective: BP 122/60   Pulse 81   Temp 98.5 F (36.9 C) (Oral)   Resp 15   Ht 5\' 5"  (1.651 m)   Wt 85.3 kg   LMP 06/11/2018 (Exact Date)   SpO2 100%   BMI 31.28 kg/m   Dilation: 4 Effacement (%): 70 Station: -2 Presentation: Vertex Exam by:: [email or web address], rnc  Fetal monitoring: Baseline: 130 bpm, Variability: Good {> 6 bpm), Accelerations: Reactive and Decelerations: Late Uterine activity: Frequency: Every 2-4 minutes and Intensity: moderate  Labs: Lab Results  Component Value Date   WBC 8.8 02/24/2019   HGB 8.9 (L) 02/24/2019   HCT 27.5 (L) 02/24/2019   MCV 99.3 02/24/2019   PLT 496 (H) 02/24/2019    Patient Active Problem List   Diagnosis Date Noted  . Intrauterine growth restriction (IUGR) affecting care of mother, third trimester, single gestation 02/24/2019  . Insufficient prenatal care 02/24/2019  . Iron deficiency anemia during pregnancy 01/03/2019  . Late prenatal care affecting pregnancy in second trimester 11/15/2018  . Trichomonal vaginitis during pregnancy 11/10/2018  . Anti-M isoimmunization affecting pregnancy, antepartum 10/21/2018  . GBS bacteriuria 10/21/2018  . Maternal varicella, non-immune 10/21/2018  . History of major depression 10/13/2018  . Supervision of normal first pregnancy 09/24/2018    Assessment / Plan: Induction of labor due to FGR,  progressing well on pitocin  #Labor: Late decels on FHT since epidural, likely 2/2 to hypotension. Improving slowly. S/p AROM. S/p FB. No significant change since last check. IUPC placed, patient tolerated well. Will hold off on pitocin for now until we see resolution of late decels. #Fetal Wellbeing:  Category II #Pain Control: Epidural and IV pain meds #ID: GBS (+), vanc #Anticipated MOD: NSVD   09/26/2018, DO, PGY-1 Family Medicine  Resident, [facility] Faculty Teaching Service  02/25/2019, 5:16 AM

## 2019-02-25 NOTE — Anesthesia Procedure Notes (Signed)
Epidural Patient location during procedure: OB Start time: 02/25/2019 4:15 AM End time: 02/25/2019 4:30 AM  Staffing Anesthesiologist: Elmer Picker, MD Performed: anesthesiologist   Preanesthetic Checklist Completed: patient identified, IV checked, risks and benefits discussed, monitors and equipment checked, pre-op evaluation and timeout performed  Epidural Patient position: sitting Prep: DuraPrep and site prepped and draped Patient monitoring: continuous pulse ox, blood pressure, heart rate and cardiac monitor Approach: midline Location: L3-L4 Injection technique: LOR air  Needle:  Needle type: Tuohy  Needle gauge: 17 G Needle length: 9 cm Needle insertion depth: 5 cm Catheter type: closed end flexible Catheter size: 19 Gauge Catheter at skin depth: 11 cm Test dose: negative  Assessment Sensory level: T8 Events: blood not aspirated, injection not painful, no injection resistance, no paresthesia and negative IV test  Additional Notes Patient identified. Risks/Benefits/Options discussed with patient including but not limited to bleeding, infection, nerve damage, paralysis, failed block, incomplete pain control, headache, blood pressure changes, nausea, vomiting, reactions to medication both or allergic, itching and postpartum back pain. Confirmed with bedside nurse the patient's most recent platelet count. Confirmed with patient that they are not currently taking any anticoagulation, have any bleeding history or any family history of bleeding disorders. Patient expressed understanding and wished to proceed. All questions were answered. Sterile technique was used throughout the entire procedure. Please see nursing notes for vital signs. Test dose was given through epidural catheter and negative prior to continuing to dose epidural or start infusion. Warning signs of high block given to the patient including shortness of breath, tingling/numbness in hands, complete motor block,  or any concerning symptoms with instructions to call for help. Patient was given instructions on fall risk and not to get out of bed. All questions and concerns addressed with instructions to call with any issues or inadequate analgesia.  Reason for block:procedure for pain

## 2019-02-25 NOTE — Anesthesia Preprocedure Evaluation (Signed)
Anesthesia Evaluation    Reviewed: Allergy & Precautions, Patient's Chart, lab work & pertinent test results  Airway Mallampati: II  TM Distance: >3 FB Neck ROM: Full    Dental no notable dental hx.    Pulmonary asthma , former smoker,    Pulmonary exam normal breath sounds clear to auscultation       Cardiovascular negative cardio ROS Normal cardiovascular exam Rhythm:Regular Rate:Normal     Neuro/Psych  Headaches, negative psych ROS   GI/Hepatic negative GI ROS, Neg liver ROS,   Endo/Other  negative endocrine ROS  Renal/GU negative Renal ROS  negative genitourinary   Musculoskeletal negative musculoskeletal ROS (+)   Abdominal   Peds  (+) ADHD Hematology  (+) Blood dyscrasia (Hgb 8.9), anemia ,   Anesthesia Other Findings IOL for IUGR  Reproductive/Obstetrics (+) Pregnancy                             Anesthesia Physical Anesthesia Plan  ASA: II  Anesthesia Plan: Epidural   Post-op Pain Management:    Induction:   PONV Risk Score and Plan: Treatment may vary due to age or medical condition  Airway Management Planned: Natural Airway  Additional Equipment:   Intra-op Plan:   Post-operative Plan:   Informed Consent: I have reviewed the patients History and Physical, chart, labs and discussed the procedure including the risks, benefits and alternatives for the proposed anesthesia with the patient or authorized representative who has indicated his/her understanding and acceptance.       Plan Discussed with: Anesthesiologist  Anesthesia Plan Comments: (Patient identified. Risks, benefits, options discussed with patient including but not limited to bleeding, infection, nerve damage, paralysis, failed block, incomplete pain control, headache, blood pressure changes, nausea, vomiting, reactions to medication, itching, and post partum back pain. Confirmed with bedside nurse the  patient's most recent platelet count. Confirmed with the patient that they are not taking any anticoagulation, have any bleeding history or any family history of bleeding disorders. Patient expressed understanding and wishes to proceed. All questions were answered. )        Anesthesia Quick Evaluation

## 2019-02-25 NOTE — Progress Notes (Signed)
Labor Progress Note Kim Fritz is a 24 y.o. G1P0 at [redacted]w[redacted]d presented for IOL for FGR. S: Resting comfortably with epidural.  O:  BP (!) 107/58   Pulse 81   Temp 98.6 F (37 C) (Axillary)   Resp 18   Ht 5\' 5"  (1.651 m)   Wt 85.3 kg   LMP 06/11/2018 (Exact Date)   SpO2 100%   BMI 31.28 kg/m  EFM: 125, moderate variability, pos accels, no decels, reactive TOCO: q 18m, MVU 225   CVE: Dilation: 5.5 Effacement (%): 80, 90 Cervical Position: Middle Station: -1 Presentation: Vertex Exam by:: [email or web address], RN   A&P: 24 y.o. G1P0 [redacted]w[redacted]d here for FGR IOL. #Labor: S/p 1 dose of BMZ. S/p FB, AROM, IUPC. Currently on Pitocin @ 29mu/min. MVU's ~ 225, adequate and making change. Anticipate SVD. #Pain: per patient request #FWB: Cat I #GBSuria; Vanc  #Anemia: Anti-M; 2 units on hold. Hgb 9.5  11m, MD  [facility] Family Medicine Residency, PGY-1 11:49 AM

## 2019-02-25 NOTE — Progress Notes (Signed)
LABOR PROGRESS NOTE  Kim Fritz is a 24 y.o. G1P0 at [redacted]w[redacted]d  admitted for IOL for FGR  Subjective: Patient doing well, breathing through contractions. Comfortable with current pain management plan.  Objective: BP (!) 107/56   Pulse 67   Temp 98.5 F (36.9 C) (Oral)   Resp 16   Ht 5\' 5"  (1.651 m)   Wt 85.3 kg   LMP 06/11/2018 (Exact Date)   BMI 31.28 kg/m   Dilation: 4 Effacement (%): 70 Station: -2 Presentation: Vertex Exam by:: [email or web address] RN Fetal monitoring: Baseline: 130 bpm, Variability: Good {> 6 bpm), Accelerations: Reactive and Decelerations: Absent Uterine activity: Frequency: Every 2-5 minutes and Intensity: moderate  Labs: Lab Results  Component Value Date   WBC 8.8 02/24/2019   HGB 8.9 (L) 02/24/2019   HCT 27.5 (L) 02/24/2019   MCV 99.3 02/24/2019   PLT 496 (H) 02/24/2019    Patient Active Problem List   Diagnosis Date Noted  . Intrauterine growth restriction (IUGR) affecting care of mother, third trimester, single gestation 02/24/2019  . Insufficient prenatal care 02/24/2019  . Iron deficiency anemia during pregnancy 01/03/2019  . Late prenatal care affecting pregnancy in second trimester 11/15/2018  . Trichomonal vaginitis during pregnancy 11/10/2018  . Anti-M isoimmunization affecting pregnancy, antepartum 10/21/2018  . GBS bacteriuria 10/21/2018  . Maternal varicella, non-immune 10/21/2018  . History of major depression 10/13/2018  . Supervision of normal first pregnancy 09/24/2018    Assessment / Plan: Induction of labor due to FGR,  progressing well on pitocin  #Labor: Progressing on Pitocin, s/p AROM. S/p FB. Pitocin @ 18 mu/min. No significant change since last check in ~6h. Will consider IUPC if no change at next check. #Fetal Wellbeing:  Category I #Pain Control: IV pain meds #ID: GBS (+), vanc #Anticipated MOD: NSVD   09/26/2018, DO, PGY-1 Family Medicine Resident, [facility] Faculty Teaching Service  02/25/2019, 12:57 AM

## 2019-02-25 NOTE — Progress Notes (Signed)
Labor Progress Note Kim Fritz is a 24 y.o. G1P0 at [redacted]w[redacted]d presented for IOL for FGR. S: Resting comfortable with epidural.  O:  BP 109/72   Pulse 73   Temp 98.4 F (36.9 C) (Oral)   Resp 16   Ht 5\' 5"  (1.651 m)   Wt 85.3 kg   LMP 06/11/2018 (Exact Date)   SpO2 100%   BMI 31.28 kg/m  EFM: 120, moderate variability, pos accels, no decels, reactive TOCO: q2-73m  CVE: Dilation: 4 Effacement (%): 90 Station: -2 Presentation: Vertex Exam by:: [email or web address] MD   A&P: 24 y.o. G1P0 [redacted]w[redacted]d here for FGR IOL. #Labor: S/p 1 dose of BMZ; second dose not indicated as now 37w. S/p FB. Currently on Pitocin. S/p AROM. IUPC now in place. Patient with recurrent lates after epidural. Pit turned off, fluid bolus, position changes and phenylephrine x2. Now Cat I again and Pit restarted at 0650. Cont Pit titration. MVU's ~ 160 currently. Anticipate SVD. #Pain: per patient request #FWB: Cat I #GBSuria; Vanc  #Anemia: Anti-M; 2 units on hold. Hgb 9.5  [redacted]w[redacted]d, MD 7:37 AM

## 2019-02-25 NOTE — Lactation Note (Signed)
This note was copied from a baby's chart. Lactation Consultation Note  Patient Name: Kim Fritz MCRFV'O Date: 02/25/2019  P1, 6 hour female infant. LC entered room mom and infant asleep at this time.    Maternal Data    Feeding    LATCH Score                   Interventions    Lactation Tools Discussed/Used     Consult Status      Danelle Earthly 02/25/2019, 11:54 PM

## 2019-02-25 NOTE — Procedures (Signed)
  Post-Placental IUD Insertion Procedure Note  Patient identified, informed consent signed prior to delivery, signed copy in chart, time out was performed.    Vaginal, labial and perineal areas thoroughly inspected for lacerations. Bilateral labial abrasions and a periurethral abrasion identified -hemostatic, not repaired prior to insertion of IUD.  Liletta  - IUD grasped with sterile ring forceps. Fundus identified through abdominal wall using non-insertion hand. IUD inserted to fundus with ring forceps. IUD carefully released at the fundus and ring forceps gently removed from vagina.    Strings trimmed to the level of the introitus. Patient tolerated procedure well.  Lot # [id number] Expiration Date 05/2022  Patient given post procedure instructions and IUD care card with expiration date.  Patient is asked to keep IUD strings tucked in her vagina until her postpartum follow up visit in 4-6 weeks. Patient advised to abstain from sexual intercourse and pulling on strings before her follow-up visit. Patient verbalized an understanding of the plan of care and agrees.   Marlowe Alt, DO OB Fellow, Faculty Practice 02/25/2019 5:44 PM

## 2019-02-25 NOTE — Discharge Instructions (Signed)

## 2019-02-25 NOTE — Discharge Summary (Signed)
Postpartum Discharge Summary     Patient Name: Kim Fritz DOB: 1996-01-18 MRN: [id number]  Date of admission: 02/24/2019 Delivering Provider: Merilyn Baba   Date of discharge: 02/27/2019  Admitting diagnosis: Intrauterine growth restriction (IUGR) affecting care of mother, third trimester, single gestation [O36.5930] Intrauterine pregnancy: [redacted]w[redacted]d    Secondary diagnosis:  Active Problems:   History of major depression   Anti-M isoimmunization affecting pregnancy, antepartum   GBS bacteriuria   Iron deficiency anemia during pregnancy   Intrauterine growth restriction (IUGR) affecting care of mother, third trimester, single gestation   Insufficient prenatal care  Additional problems: None     Discharge diagnosis: Term Pregnancy Delivered                                                                                                Post partum procedures: post placental IUD  Augmentation: AROM, Pitocin and Foley Balloon  Complications: None  Hospital course:  Induction of Labor With Vaginal Delivery   24y.o. yo G1P0 at 334w0das admitted to the hospital 02/24/2019 for induction of labor.  Indication for induction: IUGR 2%.  Patient had an uncomplicated labor course as follows:  Labor was initiated with FB and low dose pitocin. She was AROMED eventually and progressed on Pitocin to complete.   Membrane Rupture Time/Date: 12:50 AM ,02/25/2019   Intrapartum Procedures: Episiotomy: None [1]                                         Lacerations:  Labial [10];Periurethral [8]  Patient had delivery of a Viable infant.  Information for the patient's newborn:  RiCynthia, Cogle0[[id number]]    02/25/2019  Details of delivery can be found in separate delivery note.  Patient had a routine postpartum course. IUD placed after delivery. Continue PO iron on discharge. Patient is discharged home 02/27/19. Delivery time: 5:13 PM    Magnesium Sulfate received: No BMZ received: Yes  x1 Rhophylac:N/A MMR:N/A Transfusion:No  Physical exam  Vitals:   02/26/19 0500 02/26/19 1442 02/26/19 2214 02/27/19 0510  BP: 113/68 125/69 116/75 116/86  Pulse: 69 74 63 68  Resp:  16 16 (!) 27  Temp: 98.1 F (36.7 C) 98.6 F (37 C) 98.4 F (36.9 C) 98.4 F (36.9 C)  TempSrc: Oral Oral Oral   SpO2:  100%    Weight:      Height:       General: alert, cooperative and no distress Lochia: appropriate Uterine Fundus: firm Incision: N/A DVT Evaluation: No evidence of DVT seen on physical exam. Labs: Lab Results  Component Value Date   WBC 8.8 02/24/2019   HGB 8.9 (L) 02/24/2019   HCT 27.5 (L) 02/24/2019   MCV 99.3 02/24/2019   PLT 496 (H) 02/24/2019   CMP Latest Ref Rng & Units 12/20/2011  Glucose 70 - 99 mg/dL 85  BUN 6 - 23 mg/dL 30(H)  Creatinine 0.47 - 1.00 mg/dL 1.10(H)  Sodium 135 - 145 mEq/L 141  Potassium 3.5 - 5.1 mEq/L 4.2  Chloride 96 - 112 mEq/L 107  CO2 19 - 32 mEq/L -  Calcium 8.4 - 10.5 mg/dL -  Total Protein 6.0 - 8.3 g/dL -  Total Bilirubin 0.3 - 1.2 mg/dL -  Alkaline Phos 47 - 119 U/L -  AST 0 - 37 U/L -  ALT 0 - 35 U/L -    Discharge instruction: per After Visit Summary and "Baby and Me Booklet".  After visit meds:  Allergies as of 02/27/2019      Reactions   Penicillins Hives, Itching   Did it involve swelling of the face/tongue/throat, SOB, or low BP? No Did it involve sudden or severe rash/hives, skin peeling, or any reaction on the inside of your mouth or nose? No Did you need to seek medical attention at a hospital or doctor's office? Yes When did it last happen?2013 If all above answers are "NO", may proceed with cephalosporin use.      Medication List    TAKE these medications   acetaminophen 325 MG tablet Commonly known as: Tylenol Take 2 tablets (650 mg total) by mouth every 6 (six) hours as needed (for pain scale < 4).   Blood Pressure Monitor Kit 1 kit by Does not apply route once a week. Check BP Weekly. Large  Cuff DX O90.0   Blood Pressure Kit Devi 1 kit by Does not apply route once a week. Check BP Weekly.  Large Cuff.  DX: Z13.6  Z34.86  O09.0   Comfort Fit Maternity Supp Sm Misc Wear as directed.   ferrous sulfate 325 (65 FE) MG tablet Take 1 tablet (325 mg total) by mouth every other day. What changed: when to take this   ibuprofen 600 MG tablet Commonly known as: ADVIL Take 1 tablet (600 mg total) by mouth every 6 (six) hours.   prenatal multivitamin Tabs tablet Take 1 tablet by mouth daily at 12 noon.       Diet: routine diet  Activity: Advance as tolerated. Pelvic rest for 6 weeks.   Outpatient follow up:4 weeks Follow up Appt: Future Appointments  Date Time Provider [place]  03/01/2019  3:30 PM [place] [facility]  03/01/2019  3:30 PM [place] WH-MFCUS [facility]-US  03/08/2019  3:30 PM [place] [facility]  03/08/2019  3:30 PM [place] 5 WH-MFCUS M[facility]-US   Follow up Visit: Please schedule this patient for Postpartum visit in: 4 weeks with the following provider: Any provider For C/S patients schedule nurse incision check in weeks 2 weeks: no High risk pregnancy complicated by: IUGR 2%, anti-M, insufficient prenatal care, anemia Delivery mode:  SVD Anticipated Birth Control:  post placental IUD PP Procedures needed: IUD string check  Schedule Integrated [place] visit: no   Newborn Data: Live born female  Birth Weight:  2385g APGAR: 12, 9  Newborn Delivery   Birth date/time: 02/25/2019 17:13:00 Delivery type: Vaginal, Spontaneous      Baby Feeding: Breast Disposition:rooming-in vs DC home    02/27/2019 Chauncey Mann, MD

## 2019-02-26 MED ORDER — FERROUS SULFATE 325 (65 FE) MG PO TABS
325.0000 mg | ORAL_TABLET | Freq: Every day | ORAL | Status: DC
  Administered 2019-02-26 – 2019-02-27 (×2): 325 mg via ORAL
  Filled 2019-02-26 (×2): qty 1

## 2019-02-26 NOTE — Anesthesia Postprocedure Evaluation (Signed)
Anesthesia Post Note  Patient: [employer]  Procedure(s) Performed: AN AD HOC LABOR EPIDURAL     Patient location during evaluation: Mother Baby Anesthesia Type: Epidural Level of consciousness: awake and alert Pain management: pain level controlled Vital Signs Assessment: post-procedure vital signs reviewed and stable Respiratory status: spontaneous breathing, nonlabored ventilation and respiratory function stable Cardiovascular status: stable Postop Assessment: no headache, no backache, epidural receding, no apparent nausea or vomiting, patient able to bend at knees, adequate PO intake and able to ambulate Anesthetic complications: no    Last Vitals:  Vitals:   02/26/19 0017 02/26/19 0500  BP: 106/64 113/68  Pulse: 64 69  Resp:    Temp: 36.8 C 36.7 C  SpO2:      Last Pain:  Vitals:   02/26/19 0500  TempSrc: Oral  PainSc: 0-No pain   Pain Goal: Patients Stated Pain Goal: 7 (02/25/19 1509)                 Donnalee Curry Hristova

## 2019-02-26 NOTE — Progress Notes (Signed)
Post Partum Day 1 Subjective: Patient reports feeling well. She is tolerating PO. Ambulating and urinating without difficulty. Lochia minimal. Patient and FOB desire discharge today if baby is able to discharge.   Objective: Blood pressure 113/68, pulse 69, temperature 98.1 F (36.7 C), temperature source Oral, resp. rate 18, height 5\' 5"  (1.651 m), weight 85.3 kg, last menstrual period 06/11/2018, SpO2 99 %, unknown if currently breastfeeding.  Physical Exam:  General: alert, cooperative and appears stated age Lochia: appropriate Uterine Fundus: firm Incision: NA DVT Evaluation: No evidence of DVT seen on physical exam.  Recent Labs    02/24/19 0840  HGB 8.9*  HCT 27.5*    Assessment/Plan: Plan for discharge tomorrow. If baby able to discharge today, mom okay to discharge. RN to call L&D team for orders. Vitals stable. Minimal blood loss at delivery, will not repeat post-partum CBC as patient also symptomatic. PO iron ordered. S/p PP IUD placement SW consulted for lack of [facility] since December   LOS: 2 days   Kim Fritz 02/26/2019, 5:16 AM

## 2019-02-26 NOTE — Lactation Note (Signed)
This note was copied from a baby's chart. Lactation Consultation Note  Patient Name: Kim Fritz EYCXK'G Date: 02/26/2019 Reason for consult: Follow-up assessment;Early term 37-38.6wks;Other (Comment);Infant < 6lbs;1st time breastfeeding;Primapara;Infant weight loss(SGA, IUGR)  Visited with mom of a 71 hours old ETI female who is being exclusively BF, mom is a P1. She hasn't been pumping, LC and LC student Kim Fritz set her up with a DEBP, instructions, cleaning and storage were reviewed. LC also asked for coconut oil and instructed mom to use it prior pumping.  Noticed that mom has not started supplementation with baby yet. RN Kim Fritz came into the room during [facility] consultation to do baby's skin bilirubin and noted that it started to increase. Baby is due for her 24 hours newborn screen and will obtain serum bilirubin then.  Mom trying to feed baby when entering the room, offered assistance with latch and mom agreed to try STS, baby was swaddled in a blanket. LC took baby STS with mother's right breast in cross cradle position and she was able to latch after a couple of tries. A few audible swallows noted, baby unlatched briefly but LC assisted mom with latching her back on, baby still nursing when exiting the room at the 20 minutes mark.  Reviewed normal newborn behavior, LPI policy (due to baby's birth weight) cluster feeding and feeding cues. Discussed options for supplementation, mom chooses to do donor milk, the instrument for supplementation will be a slow flow nipple.   Feeding plan:  1. Encouraged mom to feed baby STS 8-12 times/24 hours or sooner if feeding cues are present 2. She'll pump every 3 hours after feedings, and will use any amount of EBM she may get prior using donor milk 3. Parents will start supplementing baby with 10-20 ml of donor milk per feeding every 3 hours, they're aware that the amounts will increase again once baby turns 48 hours old.  Parents agreeable with feeding  plan. They reported all questions and concerns were answered, they're both aware of LC OP services and will call PRN.   Maternal Data    Feeding Feeding Type: Breast Fed  LATCH Score Latch: Repeated attempts needed to sustain latch, nipple held in mouth throughout feeding, stimulation needed to elicit sucking reflex.  Audible Swallowing: A few with stimulation(with breast compressions)  Type of Nipple: Everted at rest and after stimulation  Comfort (Breast/Nipple): Soft / non-tender  Hold (Positioning): Assistance needed to correctly position infant at breast and maintain latch.  LATCH Score: 7  Interventions Interventions: Breast feeding basics reviewed;Assisted with latch;Breast massage;Skin to skin;Breast compression;Hand express;Adjust position;Support pillows;Coconut oil;DEBP  Lactation Tools Discussed/Used Tools: Pump;Coconut oil Breast pump type: Double-Electric Breast Pump Pump Review: Setup, frequency, and cleaning Initiated by:: MPeck and LC student Kim Fritz Date initiated:: 02/26/19   Consult Status Consult Status: Follow-up Date: 02/27/19 Follow-up type: In-patient    Kim Fritz 02/26/2019, 5:27 PM

## 2019-02-26 NOTE — Progress Notes (Signed)
MOB was referred for history of depression/anxiety.  * Referral screened out by Clinical Social Worker because none of the following criteria appear to apply:  ~ History of anxiety/depression during this pregnancy, or of post-partum depression following prior delivery. ~ Diagnosis of anxiety and/or depression within last 3 years. Per chart review MOB reports hx was during high school, no concerns in PNC records.  OR * MOB's symptoms currently being treated with medication and/or therapy.   CSW received consult for late and limited PNC.  CSW reviewed chart and is screening out consult as it does not meet criteria for automatic CSW involvement and infant drug screening.  MOB started care prior to 28 weeks and had more than 3 visits.    Please contact the Clinical Social Worker if needs arise, by MOB request, or if MOB scores greater than 9/yes to question 10 on [place] Postpartum Depression Screen.   Amos Micheals D. Sterling Mondo, MSW LCSWA  

## 2019-02-26 NOTE — Lactation Note (Signed)
This note was copied from a baby's chart. Lactation Consultation Note  Patient Name: Kim Fritz CNOBS'J Date: 02/26/2019 Reason for consult: Initial assessment;1st time breastfeeding P1, 12 hour ETI  female infant, IUGR. Per mom, she feels breastfeeding is going well, infant is latching 15 minutes per feeding at breast, LC did not see latch at this time. Per mom, she did hand express twice and gave infant back volume after latching infant at breast.  Mom doesn't want to use DEBP until later today she is very tired.  Mom knows to breastfeed according to hunger cues, 8 to 12 times within 24 hours, on demand and not exceed 3 hours without breastfeeding infant. Mom will continue to do STS as much as possible. Mom knows to call RN or LC if she needs assistance with latching infant at breast. Reviewed Baby & Me book's Breastfeeding Basics.  Mom made aware of O/P services, breastfeeding support groups, community resources, and our phone # for post-discharge questions.   Maternal Data Formula Feeding for Exclusion: No Has patient been taught Hand Expression?: Yes Does the patient have breastfeeding experience prior to this delivery?: No  Feeding    LATCH Score                   Interventions Interventions: Breast feeding basics reviewed;Skin to skin;Hand express;DEBP  Lactation Tools Discussed/Used WIC Program: Yes   Consult Status Consult Status: Follow-up Date: 02/26/19 Follow-up type: In-patient    Danelle Earthly 02/26/2019, 5:35 AM

## 2019-02-27 LAB — TYPE AND SCREEN
ABO/RH(D): O POS
Antibody Screen: POSITIVE
Donor AG Type: NEGATIVE
Donor AG Type: NEGATIVE
PT AG Type: NEGATIVE
Unit division: 0
Unit division: 0

## 2019-02-27 LAB — BPAM RBC
Blood Product Expiration Date: 202102252359
Blood Product Expiration Date: 202102282359
Unit Type and Rh: 5100
Unit Type and Rh: 5100

## 2019-02-27 MED ORDER — FERROUS SULFATE 325 (65 FE) MG PO TABS: 325.0000 mg | ORAL_TABLET | ORAL | 0 refills | Status: DC

## 2019-02-27 MED ORDER — ACETAMINOPHEN 325 MG PO TABS: 650.0000 mg | ORAL_TABLET | Freq: Four times a day (QID) | ORAL | 0 refills | Status: DC | PRN

## 2019-02-27 MED ORDER — IBUPROFEN 600 MG PO TABS: 600.0000 mg | ORAL_TABLET | Freq: Four times a day (QID) | ORAL | 0 refills | Status: DC

## 2019-02-27 NOTE — Lactation Note (Signed)
This note was copied from a baby's chart. Lactation Consultation Note  Patient Name: Kim Fritz JEHUD'J Date: 02/27/2019 Reason for consult: Follow-up assessment;Early term 37-38.6wks;Infant < 6lbs;Primapara;1st time breastfeeding;Infant weight loss;Other (Comment)(8 % weight loss)  Baby is 62 hpurs old  As LC entered the room baby latched on the right breast cross cradle and noted to  Be lip feeding more than using her jaw. LC flipped the upper lip to increase depth and ease chin. Increased swallows noted and LC mentioned to mom to note also.  Nipple well rounded when baby released.  Baby still showing feeding cues and LC offered to assist to latch on the left breast / football / and worked on proper support and depth. Baby opened wide and with compressions lips more flanged and baby was using her jaw more.  Per mom comfortable.  Mom mentioned the Pedis recommended supplementing if she wanted to.  LC recommended since the baby is 8 % weight loss / less than 6 pounds, and  ( Probably will increase with cluster feeding ) ,after feeding at the breast when the   baby isn't cluster feeding to post pump both breast for 10- -15 mins and feed the milk back to the baby for free calories the baby doesn't have to work hard for to increase weight.  Mom's breast are fuller and when hand expressing the milk was flowing well.  Per mom her nipples are alittle tender. Sore nipple and engorgement prevention and  tx reviewed. LC instructed mom on the use of comfort gels x 6 days after feeding or pumping and alternating with breast shells when awake to elongate the nipple / areola complex for a deeper latch.  LC assessed breast tissue , no breakdown and the nipple tissue appear healthy.  LC reminded mom of the [facility] [email or web address] for the virtual sport groups and the phone LC numbers.  Per mom has a a DEBP at home.  Mom and dad receptive teaching and review.  LC praised mom for her breastfeeding efforts.     Maternal Data Has patient been taught Hand Expression?: Yes  Feeding Feeding Type: Breast Fed  LATCH Score Latch: Grasps breast easily, tongue down, lips flanged, rhythmical sucking.  Audible Swallowing: Spontaneous and intermittent  Type of Nipple: Everted at rest and after stimulation  Comfort (Breast/Nipple): Filling, red/small blisters or bruises, mild/mod discomfort  Hold (Positioning): Assistance needed to correctly position infant at breast and maintain latch.  LATCH Score: 8  Interventions Interventions: Breast feeding basics reviewed;Assisted with latch;Skin to skin;Breast massage;Hand express;Breast compression;Adjust position;Support pillows;Position options;Hand pump;DEBP  Lactation Tools Discussed/Used Tools: Pump;Shells;Comfort gels Shell Type: Inverted Breast pump type: Manual;Double-Electric Breast Pump Pump Review: Milk Storage   Consult Status Consult Status: Complete Date: 02/27/19    [name] 02/27/2019, 9:59 AM

## 2019-03-01 ENCOUNTER — Ambulatory Visit (HOSPITAL_COMMUNITY): Payer: Medicaid Other

## 2019-03-01 ENCOUNTER — Encounter (HOSPITAL_COMMUNITY): Payer: Self-pay

## 2019-03-02 LAB — SURGICAL PATHOLOGY

## 2019-03-08 ENCOUNTER — Ambulatory Visit (HOSPITAL_COMMUNITY): Payer: Medicaid Other

## 2019-03-31 ENCOUNTER — Ambulatory Visit: Payer: Medicaid Other | Admitting: Certified Nurse Midwife

## 2020-02-05 IMAGING — US US MFM OB FOLLOW-UP
1 series · 13 of 28 positions shown · non-contrast
Comparison: none

[Series 1: us mfm ob follow-up · 47 acquisitions, 13 frames shown]
[im 2/47]
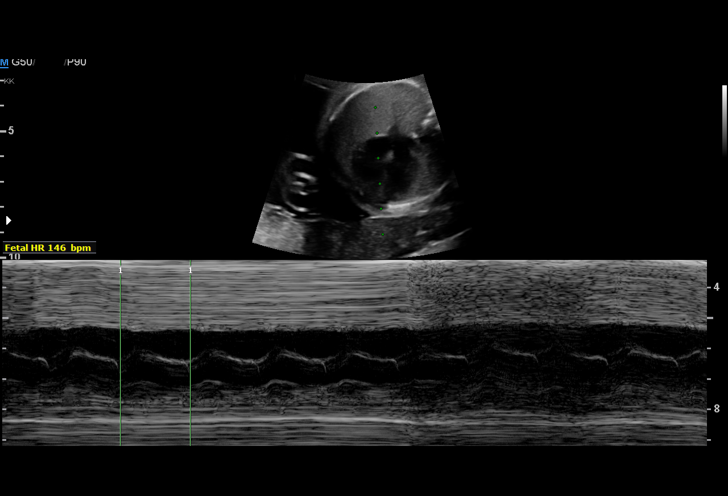
[im 6/47]
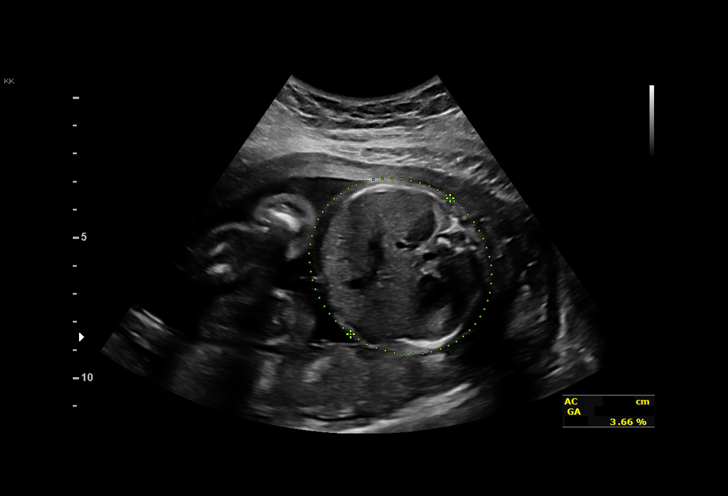
[im 9/47]
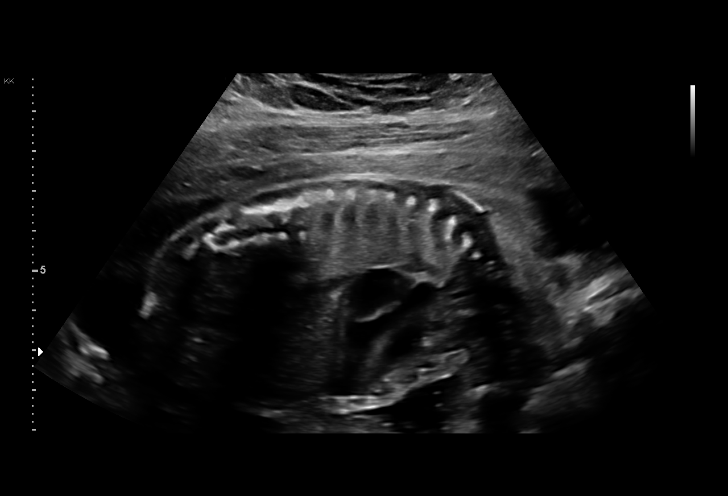
[im 12/47]
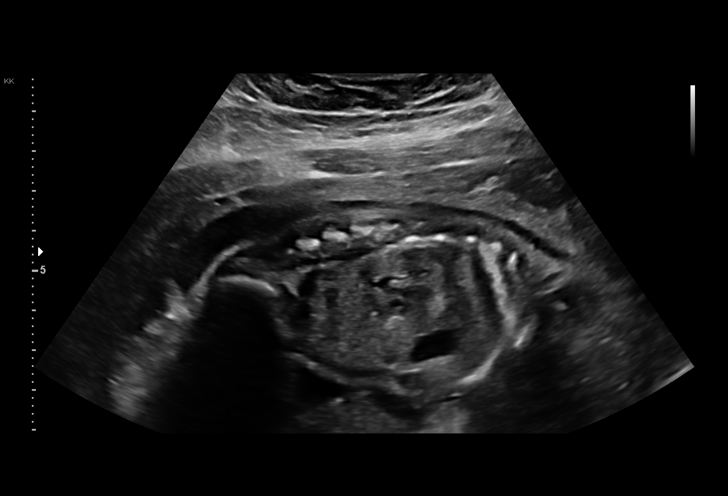
[im 16/47]
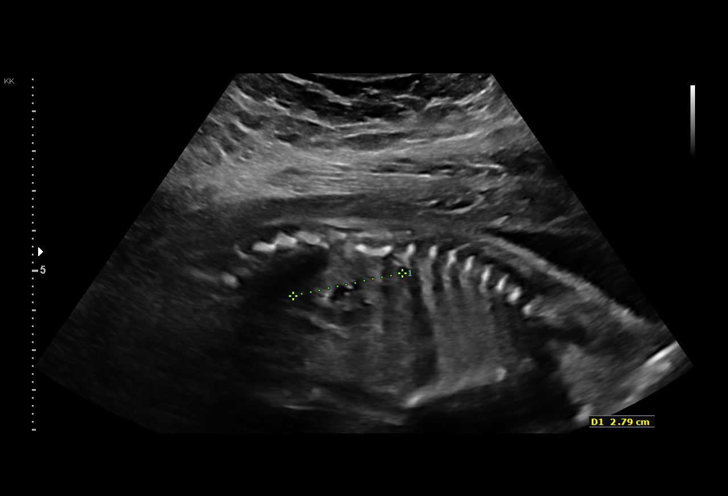
[im 19/47]
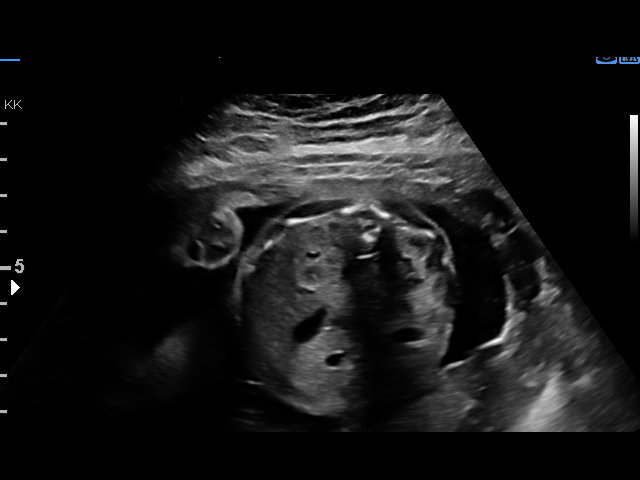
[im 24/47]
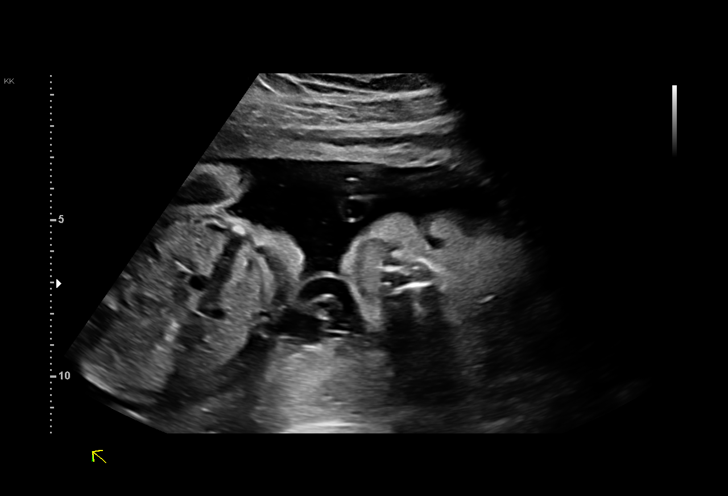
[im 28/47]
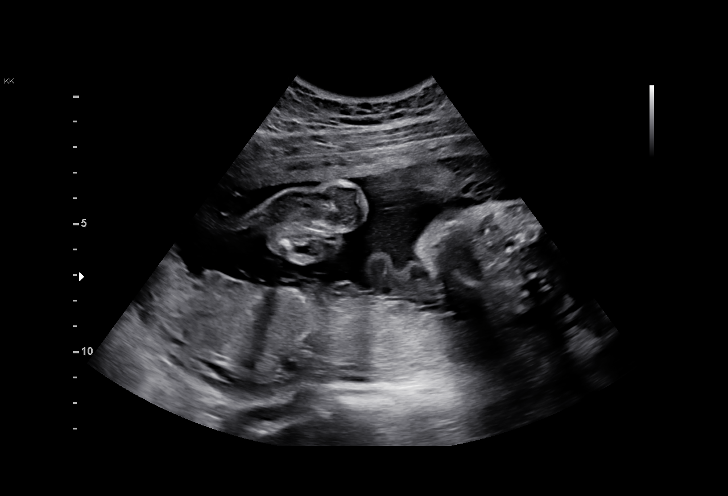
[im 31/47]
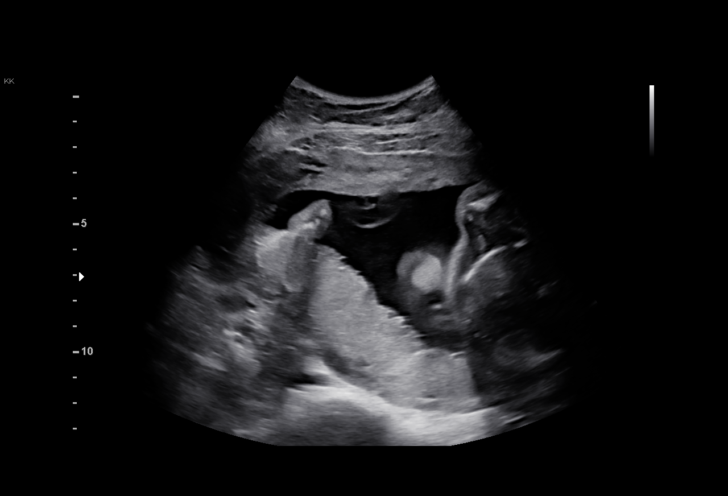
[im 35/47]
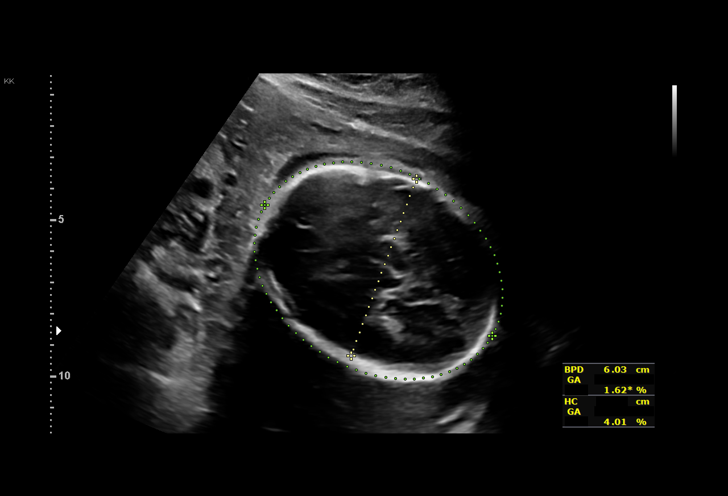
[im 38/47]
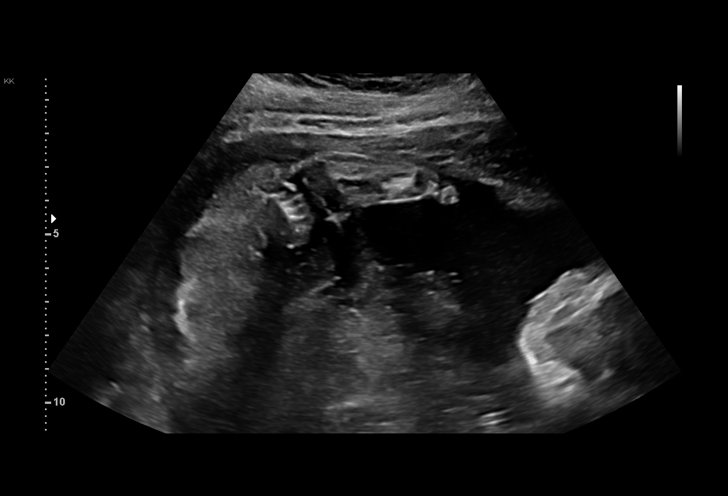
[im 41/47]
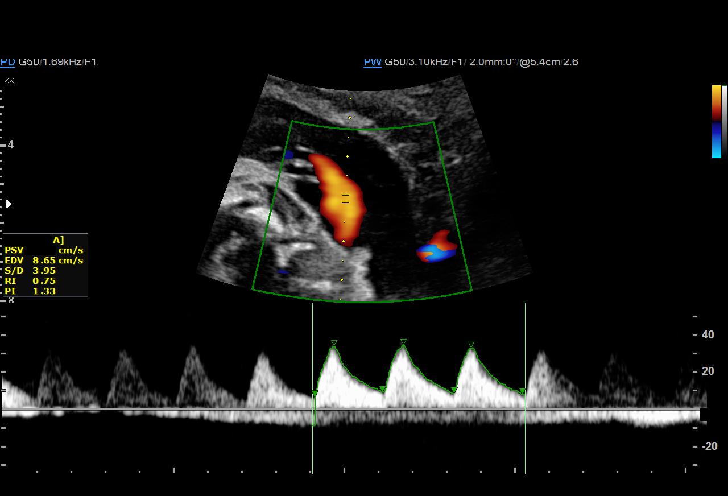
[im 45/47]
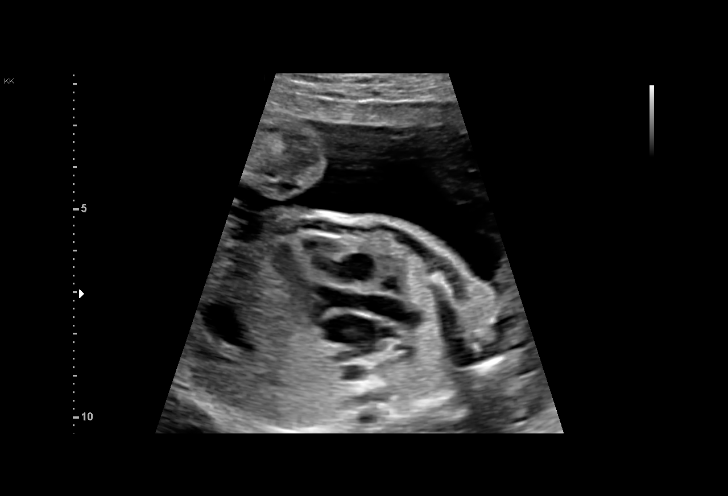

[13 of 28 positions shown; findings below may reference images not displayed]

[REDACTED]
                   [id number]

 ----------------------------------------------------------------------

 ----------------------------------------------------------------------
Indications

  Maternal care for known or suspected poor
  fetal growth, second trimester, not applicable
  or unspecified IUGR
  Low Risk NIPS
  Encounter for other antenatal screening
  follow-up
  26 weeks gestation of pregnancy
 ----------------------------------------------------------------------
Fetal Evaluation

 Num Of Fetuses:         1
 Fetal Heart Rate(bpm):  146
 Cardiac Activity:       Observed
 Presentation:           Cephalic
 Placenta:               Posterior
 P. Cord Insertion:      Previously Visualized

 Amniotic Fluid
 AFI FV:      Within normal limits

                             Largest Pocket(cm)

Biometry

 BPD:      59.9  mm     G. Age:  24w 3d          1  %    CI:         67.1   %    70 - 86
                                                         FL/HC:      20.3   %    18.6 -
 HC:      234.2  mm     G. Age:  25w 3d          4  %    HC/AC:      1.16        1.05 -
 AC:      201.5  mm     G. Age:  24w 5d          4  %    FL/BPD:     79.3   %    71 - 87
 FL:       47.5  mm     G. Age:  25w 6d         17  %    FL/AC:      23.6   %    20 - 24

 Est. FW:     784  gm    1 lb 12 oz       5  %
OB History

 Gravidity:    1         Term:   0        Prem:   0        SAB:   0
 TOP:          0       Ectopic:  0        Living: 0
Gestational Age

 LMP:           26w 4d        Date:  06/11/18                 EDD:   03/18/19
 U/S Today:     25w 1d                                        EDD:   03/28/19
 Best:          26w 4d     Det. By:  LMP  (06/11/18)          EDD:   03/18/19
Anatomy

 Cranium:               Appears normal         LVOT:                   Previously seen
 Cavum:                 Previously seen        Aortic Arch:            Previously seen
 Ventricles:            Appears normal         Ductal Arch:            Previously seen
 Choroid Plexus:        Previously seen        Diaphragm:              Previously seen
 Cerebellum:            Previously seen        Stomach:                Appears normal, left
                                                                       sided
 Posterior Fossa:       Previously seen        Abdomen:                Appears normal
 Nuchal Fold:           Previously seen        Abdominal Wall:         Appears nml (cord
                                                                       insert, abd wall)
 Face:                  Orbits and profile     Cord Vessels:           Appears normal (3
                        previously seen                                vessel cord)
 Lips:                  Appears normal         Kidneys:                Appear normal
 Palate:                Previously seen        Bladder:                Appears normal
 Thoracic:              Previously seen        Spine:                  Previously seen
 Heart:                 Appears normal         Upper Extremities:      Previously seen
                        (4CH, axis, and
                        situs)
 RVOT:                  Previously seen        Lower Extremities:      Previously seen

 Other:  Feet/heels visualized. Nasal bone visualized. Open hands previously
         visualized. Technically difficult due to fetal position.
Doppler - Fetal Vessels

 Umbilical Artery
  S/D     %tile     RI              PI                     ADFV    RDFV
 4.06       86   0.75             1.34                        No      No

Cervix Uterus Adnexa

 Cervix
 Not visualized (advanced GA >73wks)
Impression

 Fetal growth restriction.
 On today's ultrasound, the estimated fetal weight is at the 5th
 percentile.  Amniotic fluid is normal good fetal activity seen.
 Umbilical artery Doppler showed normal forward diastolic flow.
 I explained the findings and reassured her of normal Doppler
 findings.
Recommendations

 -UA Doppler study in 2 weeks.
 -Fetal growth assessment in UA Doppler study in 3 weeks.
                 Gowan, Jyotsna

## 2020-02-20 IMAGING — US US MFM UA CORD DOPPLER
1 series · 15 of 16 positions shown · non-contrast
Comparison: none

[Series 1: us mfm ua cord doppler · 16 acquisitions, 15 frames shown]
[im 1/16]
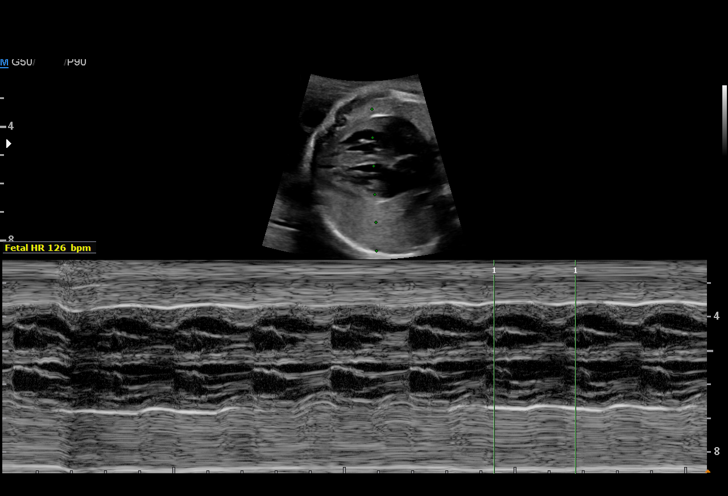
[im 2/16]
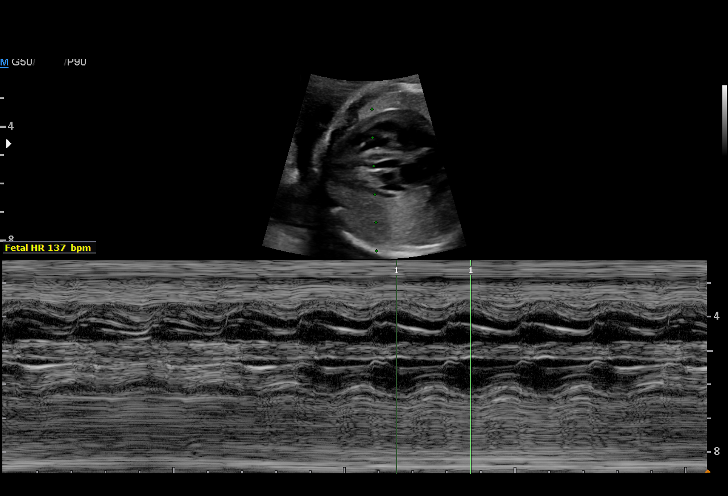
[im 3/16]
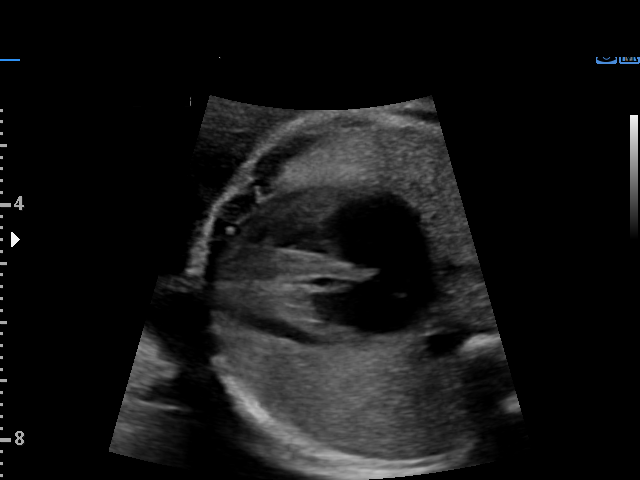
[im 4/16]
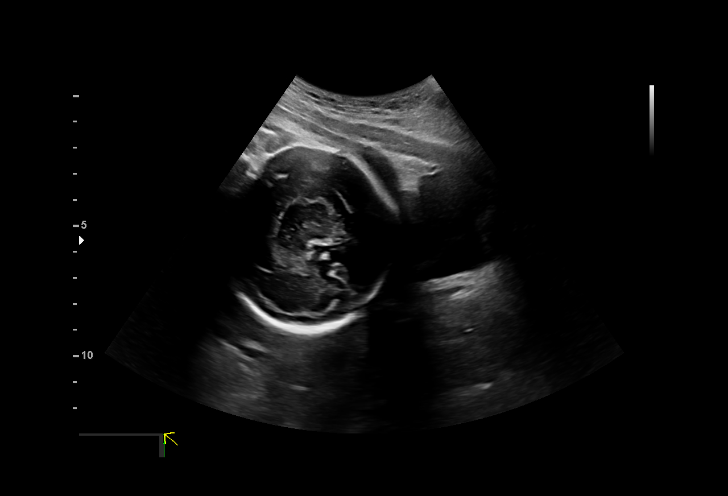
[im 5/16]
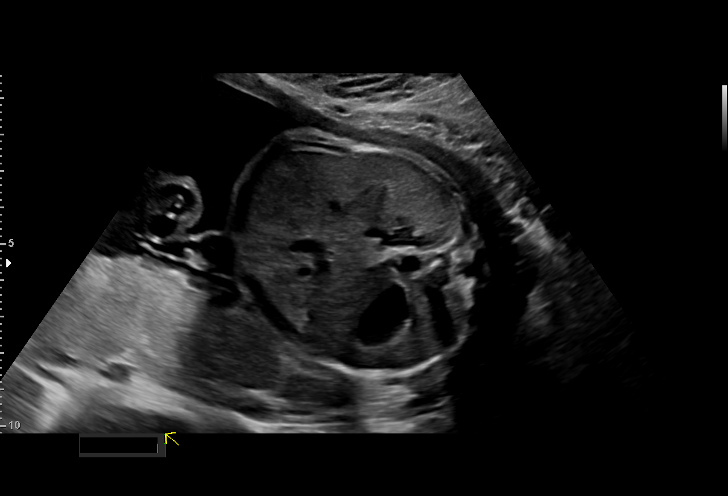
[im 6/16]
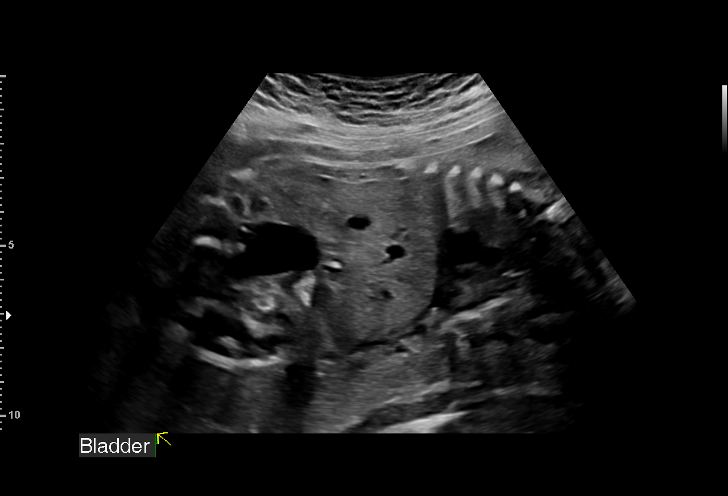
[im 7/16]
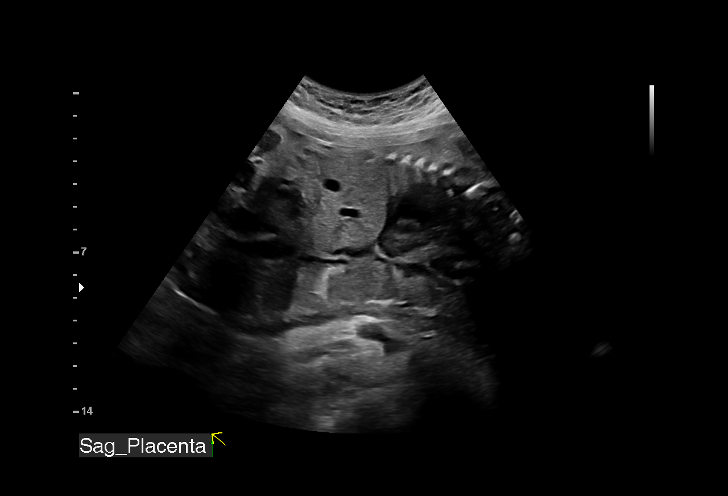
[im 9/16]
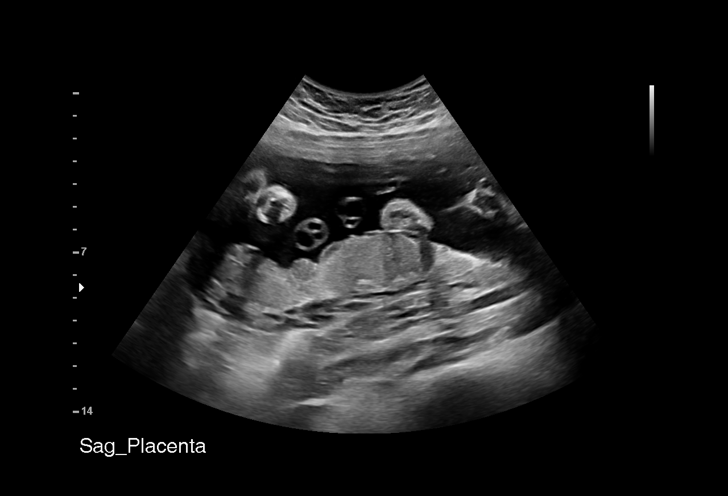
[im 10/16]
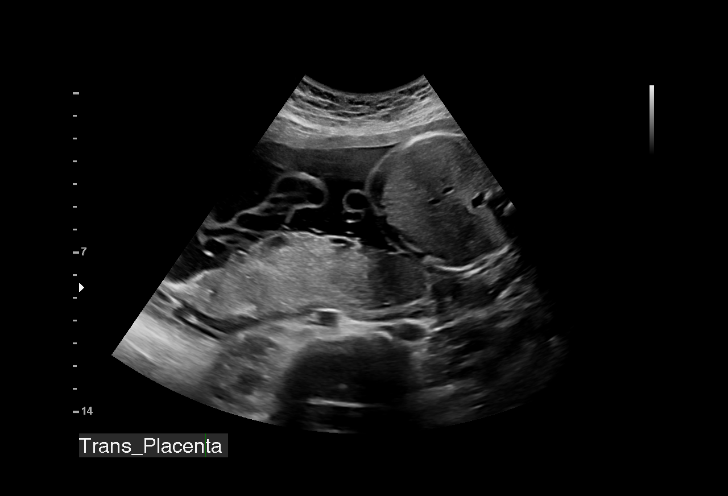
[im 11/16]
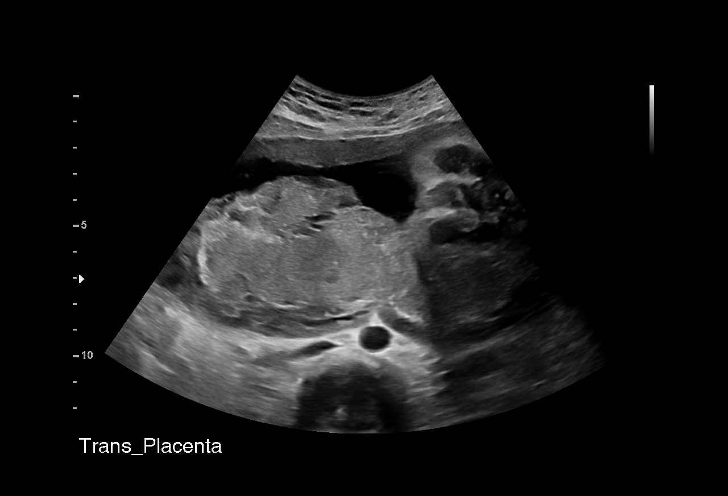
[im 12/16]
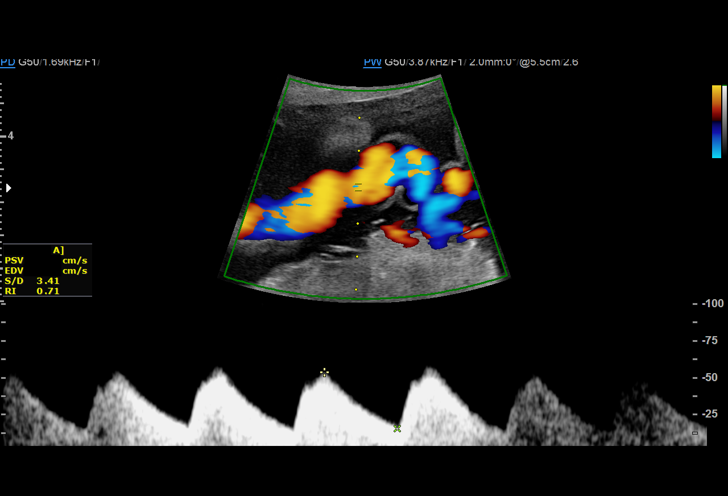
[im 13/16]
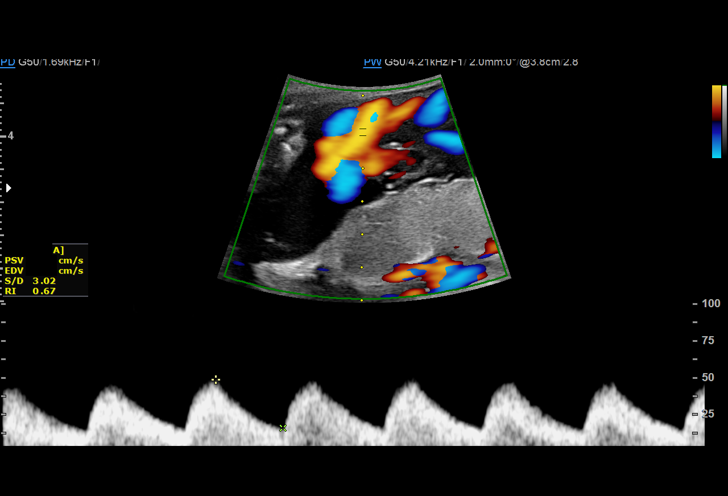
[im 14/16]
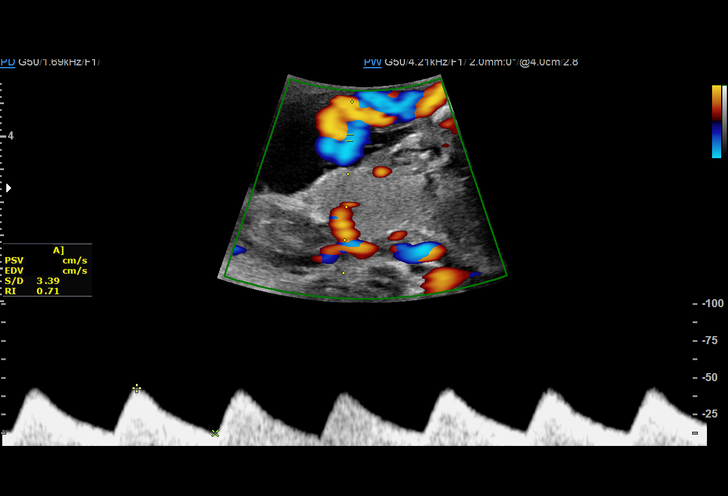
[im 15/16]
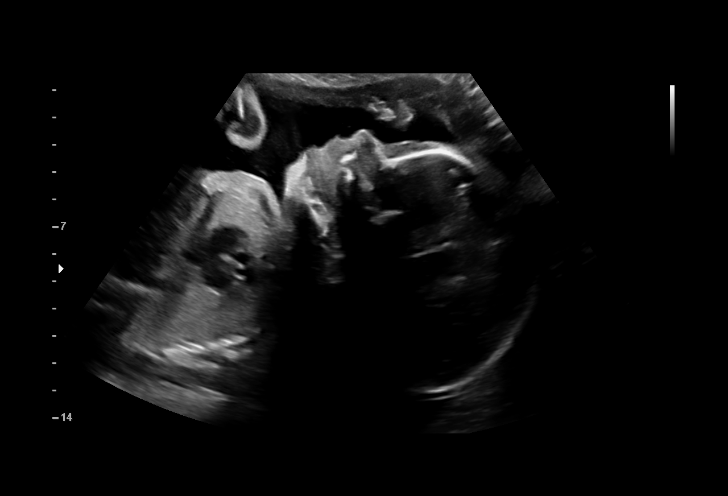
[im 16/16]
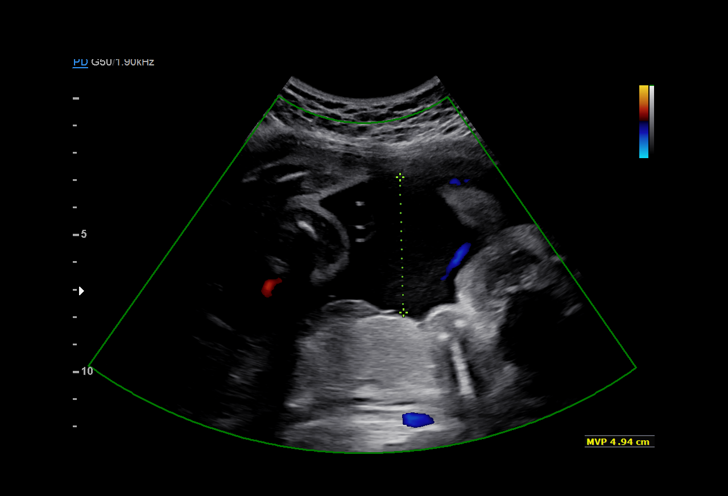

[15 of 16 positions shown; findings below may reference images not displayed]

[REDACTED]
                   [id number]

 ----------------------------------------------------------------------

 ----------------------------------------------------------------------
Indications

  Maternal care for known or suspected poor
  fetal growth, third trimester, not applicable or
  unspecified IUGR
  Low Risk NIPS
  Encounter for other antenatal screening
  follow-up
  28 weeks gestation of pregnancy
 ----------------------------------------------------------------------
Vital Signs

 BMI:
Fetal Evaluation

 Num Of Fetuses:         1
 Fetal Heart Rate(bpm):  126
 Cardiac Activity:       Observed
 Presentation:           Cephalic
 Placenta:               Posterior
 P. Cord Insertion:      Previously Visualized

 Amniotic Fluid
 AFI FV:      Within normal limits
                             Largest Pocket(cm)

OB History

 Gravidity:    1         Term:   0        Prem:   0        SAB:   0
 TOP:          0       Ectopic:  0        Living: 0
Gestational Age

 LMP:           28w 5d        Date:  06/11/18                 EDD:   03/18/19
 Best:          28w 5d     Det. By:  LMP  (06/11/18)          EDD:   03/18/19
Anatomy

 Thoracic:              Appears normal         Bladder:                Appears normal
 Stomach:               Appears normal, left
                        sided
Doppler - Fetal Vessels

 Umbilical Artery
  S/D     %tile                                            ADFV    RDFV
 3.27       67                                                No      No

Cervix Uterus Adnexa

 Cervix
 Not visualized (advanced GA >09wks)
Comments

 This patient was seen due to an IUGR fetus.  She denies any
 problems since her last exam.
 There was normal amniotic fluid noted on today's ultrasound
 exam.
 Doppler studies of the umbilical arteries performed due to
 fetal growth restriction showed a normal S/D ratio of 3.27.
 A follow-up exam was scheduled in 1 week.

## 2020-04-01 IMAGING — US US MFM UA CORD DOPPLER
1 series · 15 of 28 positions shown · non-contrast
Comparison: none

[Series 1: us mfm ua cord doppler · 30 acquisitions, 15 frames shown]
[im 1/30]
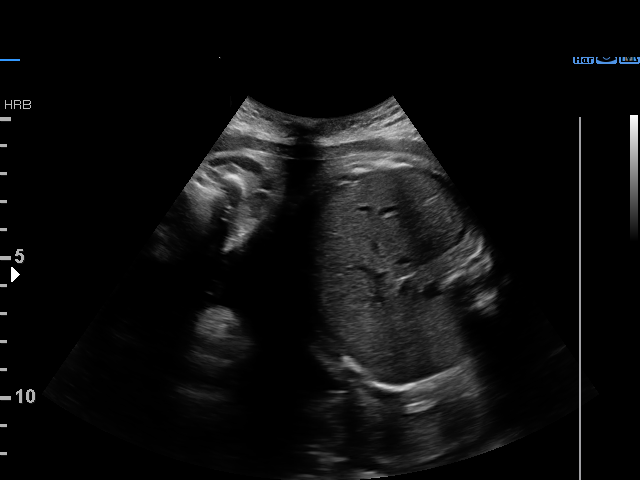
[im 3/30]
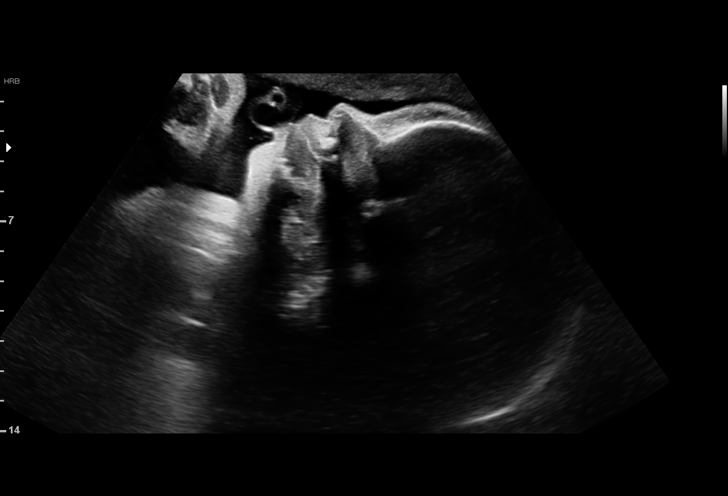
[im 5/30]
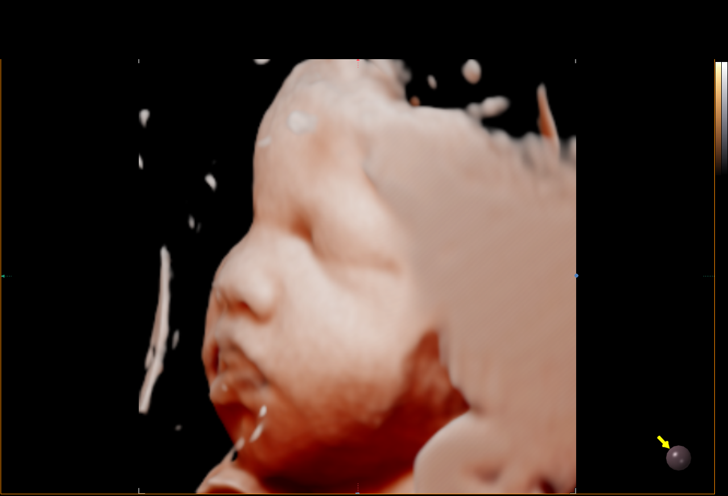
[im 7/30]
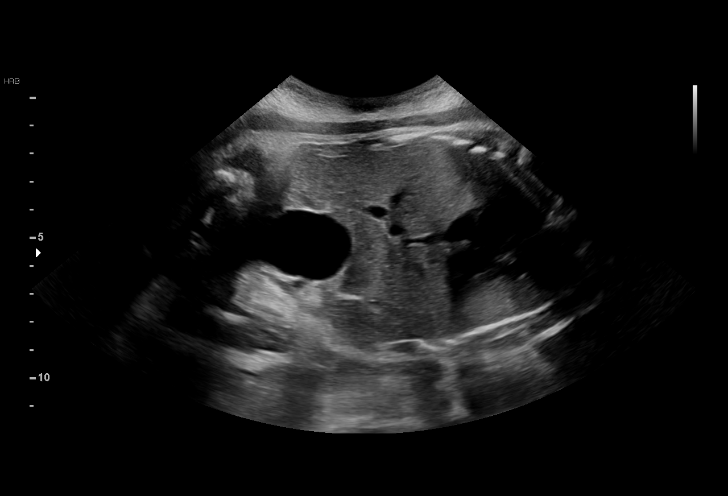
[im 9/30]
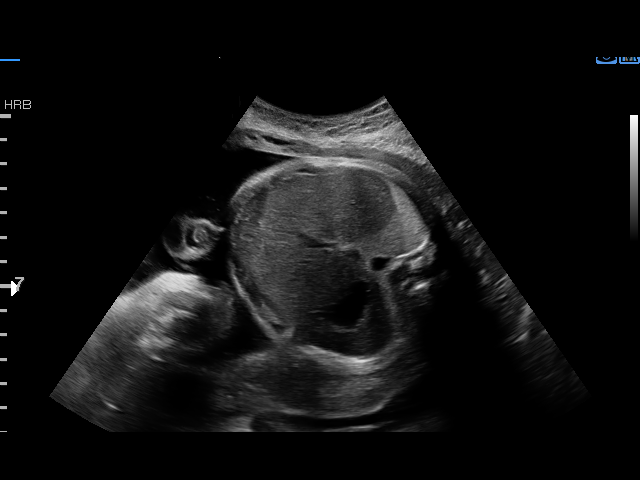
[im 11/30]
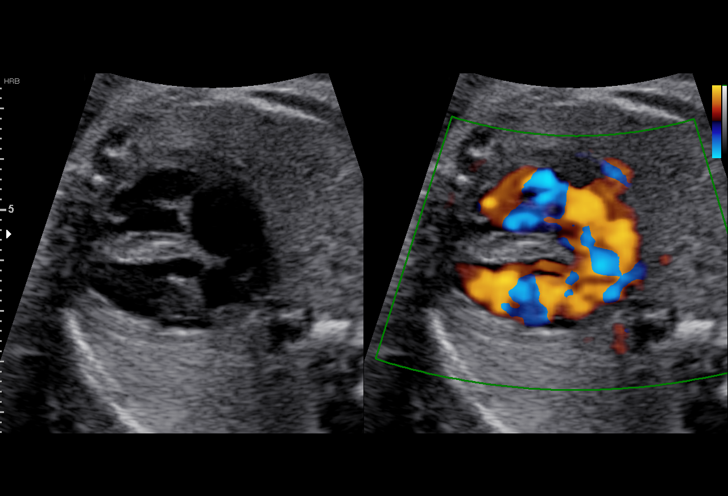
[im 13/30]
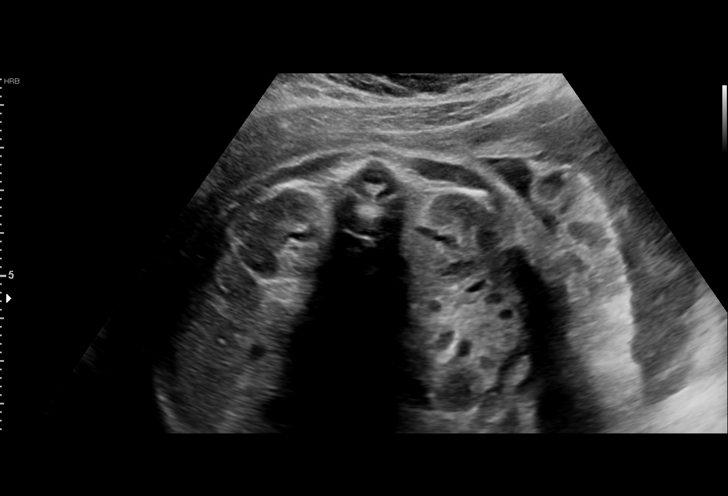
[im 16/30]
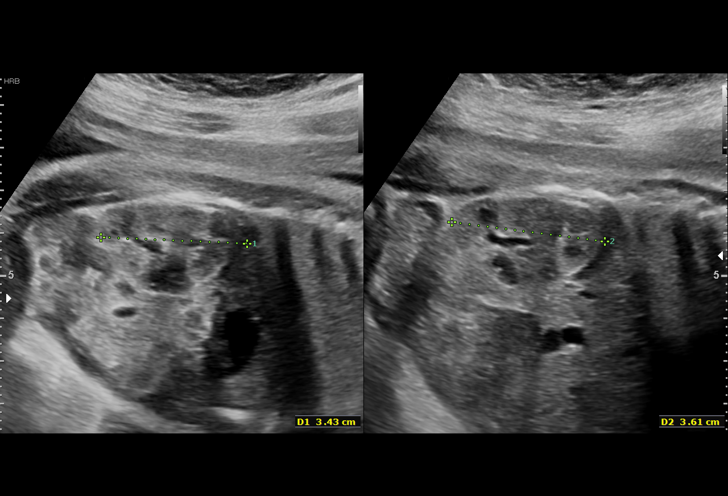
[im 17/30]
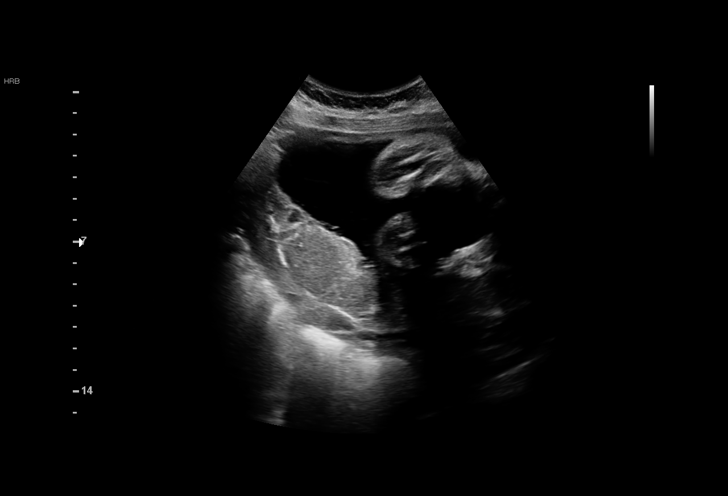
[im 19/30]
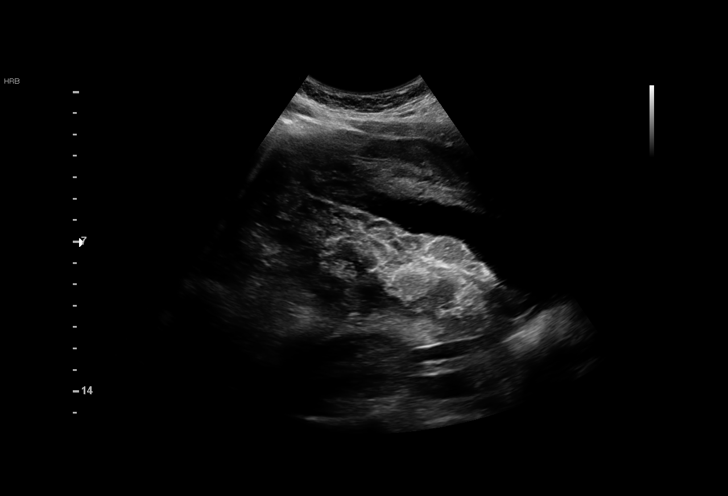
[im 21/30]
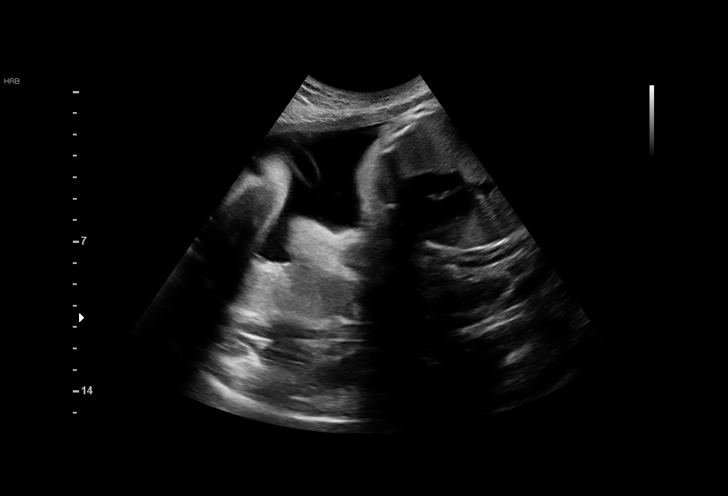
[im 23/30]
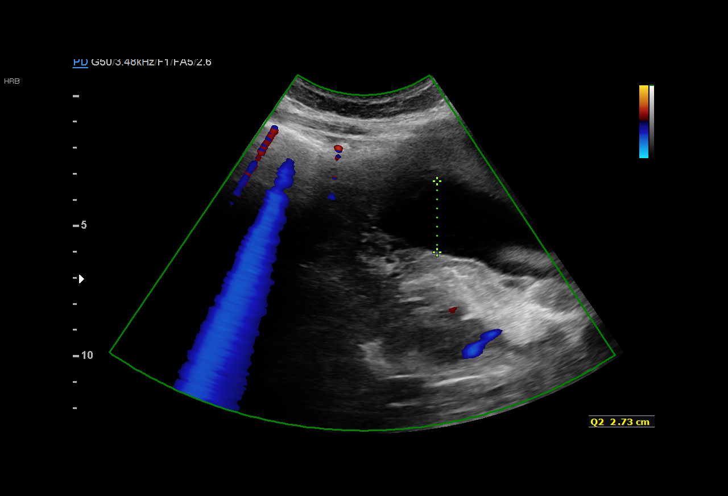
[im 25/30]
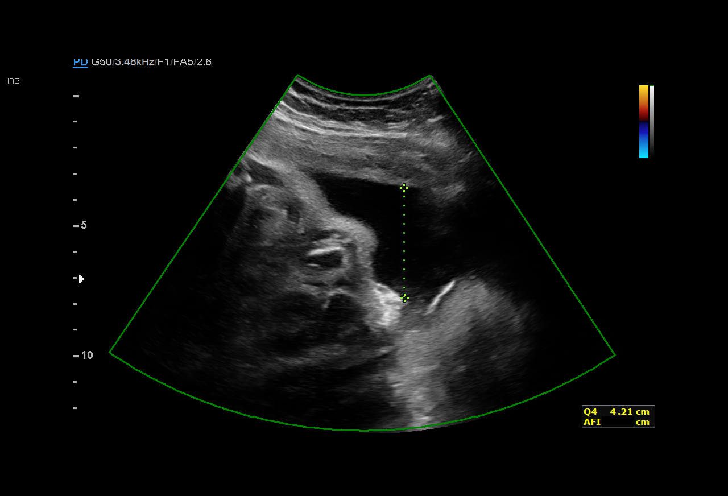
[im 27/30]
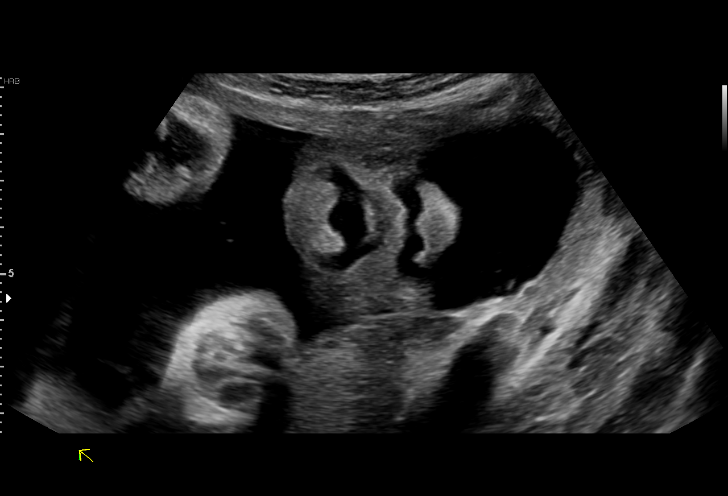
[im 30/30]
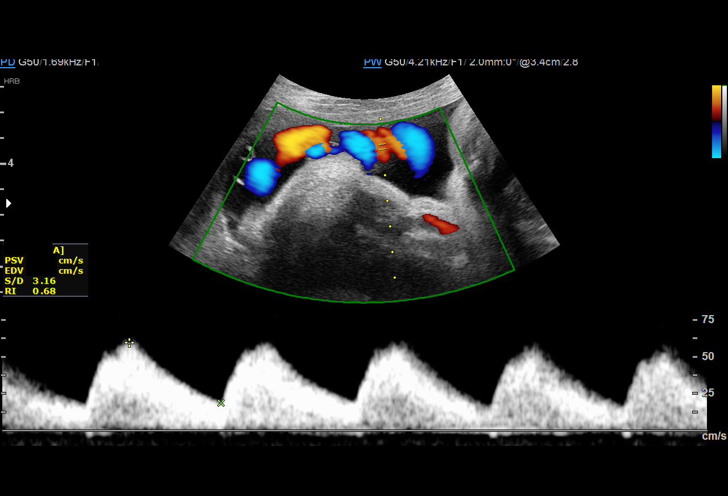

[15 of 28 positions shown; findings below may reference images not displayed]

[REDACTED]
                   [id number]

 ----------------------------------------------------------------------

 ----------------------------------------------------------------------
Indications

  Maternal care for known or suspected poor
  fetal growth, third trimester, not applicable or
  unspecified IUGR
  Low Risk NIPS
  Anemia during pregnancy in third trimester
  34 weeks gestation of pregnancy
 ----------------------------------------------------------------------
Vital Signs

 BMI:
Fetal Evaluation

 Num Of Fetuses:         1
 Cardiac Activity:       Observed
 Presentation:           Cephalic
 Placenta:               Posterior

 Amniotic Fluid
 AFI FV:      Within normal limits
 AFI Sum(cm)     %Tile       Largest Pocket(cm)
 15.12           54

 RUQ(cm)       RLQ(cm)       LUQ(cm)        LLQ(cm)

Biophysical Evaluation

 Amniotic F.V:   Within normal limits       F. Tone:        Observed
 F. Movement:    Observed                   Score:          [DATE]
 F. Breathing:   Observed
OB History

 Gravidity:    1         Term:   0        Prem:   0        SAB:   0
 TOP:          0       Ectopic:  0        Living: 0
Gestational Age

 LMP:           34w 4d        Date:  06/11/18                 EDD:   03/18/19
 Best:          34w 4d     Det. By:  LMP  (06/11/18)          EDD:   03/18/19
Anatomy

 Thoracic:              Appears normal         Kidneys:                Appear normal
 Heart:                 Appears normal         Bladder:                Appears normal
                        (4CH, axis, and
                        situs)
 Stomach:               Appears normal, left
                        sided
Doppler - Fetal Vessels

 Umbilical Artery
  S/D     %tile                                            ADFV    RDFV
 2.87       73                                                No      No

Cervix Uterus Adnexa

 Cervix
 Not visualized (advanced GA >12wks)
Impression

 Fetal growth restriction.
 Amniotic fluid is normal and good fetal activity is seen.
 Antenatal testing is reassuring. BPP [DATE]. Umbilical artery
 Doppler showed normal forward diastolic flow.
Recommendations

 -Fetal growth, BPP, UA Doppler next visit.
 -To discuss timing of delivery next week.
                 Gartner, Rashaun

## 2020-04-15 IMAGING — US US MFM UA CORD DOPPLER
1 series · 15 of 28 positions shown · non-contrast
Comparison: none

[Series 1: us mfm ua cord doppler · 44 acquisitions, 15 frames shown]
[im 1/44]
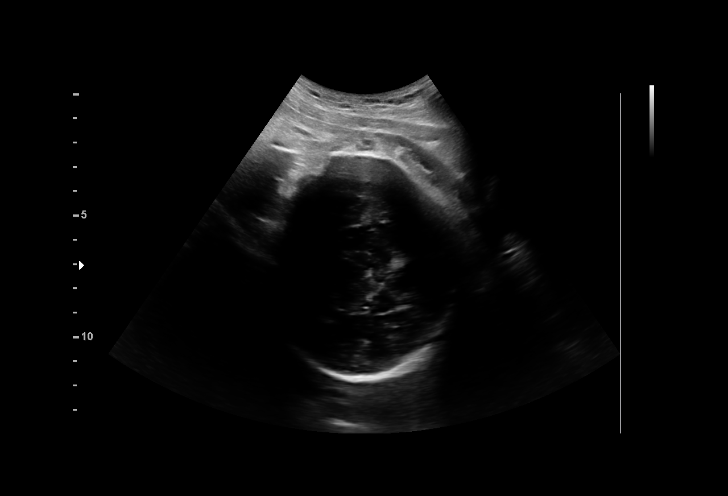
[im 4/44]
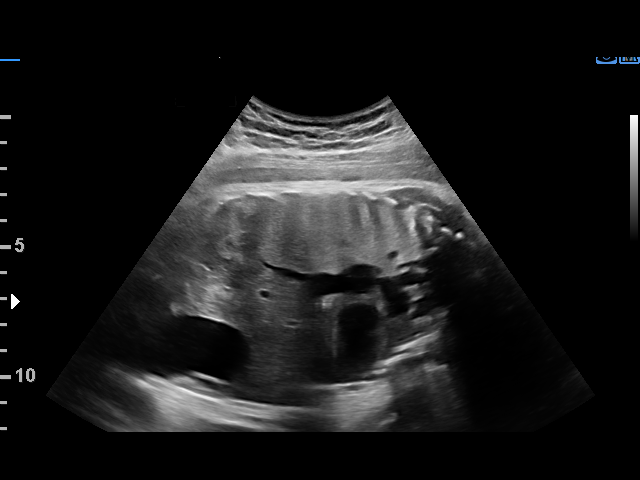
[im 7/44]
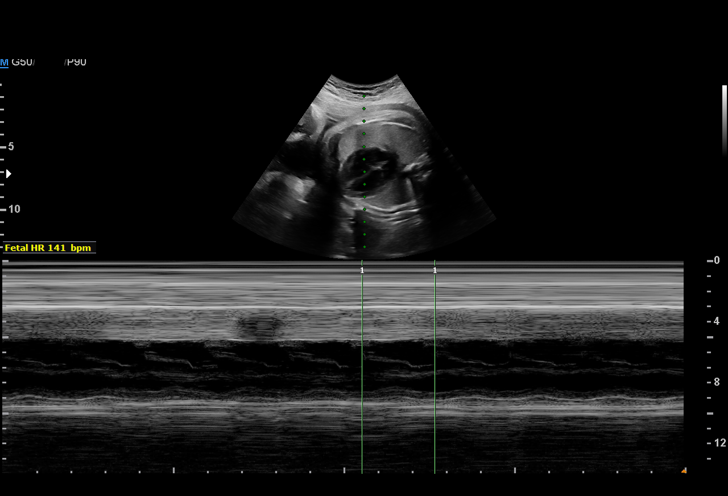
[im 10/44]
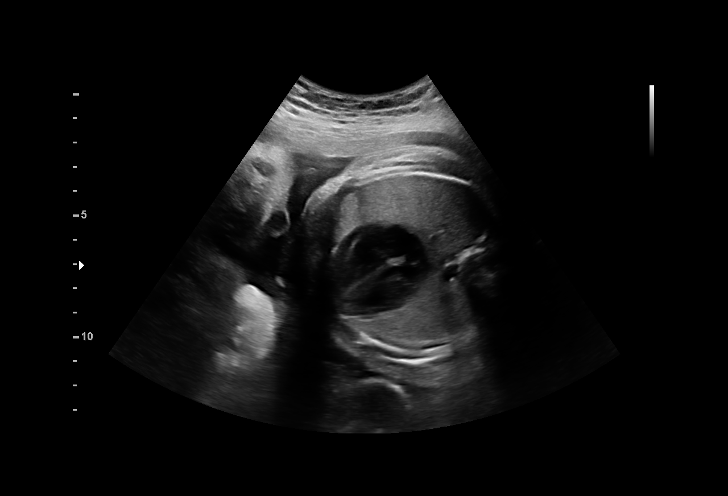
[im 13/44]
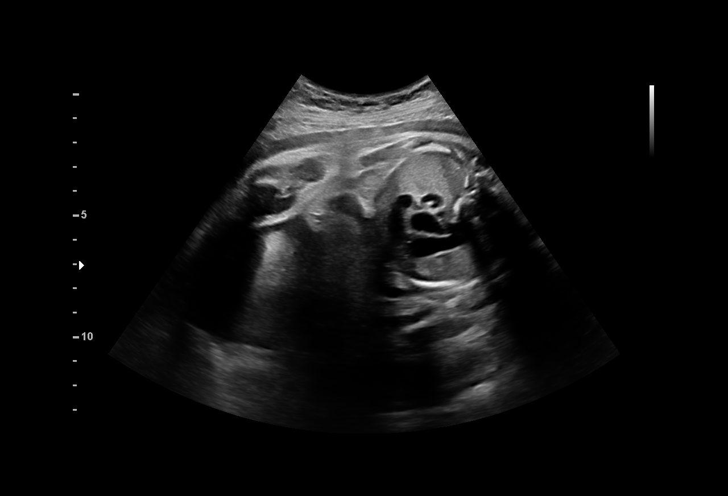
[im 16/44]
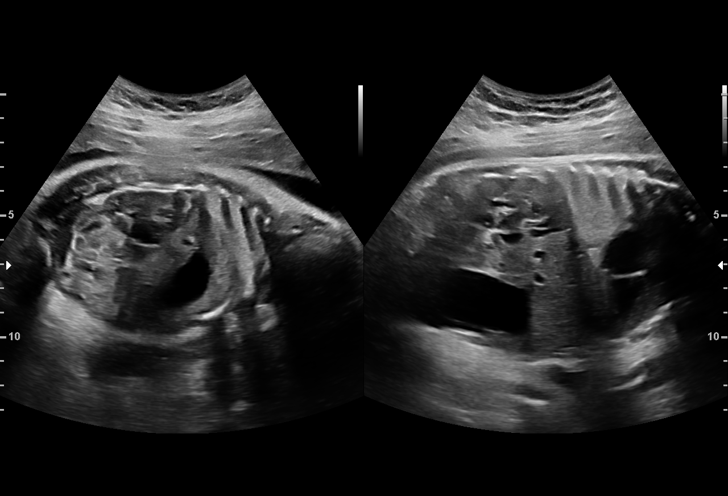
[im 20/44]
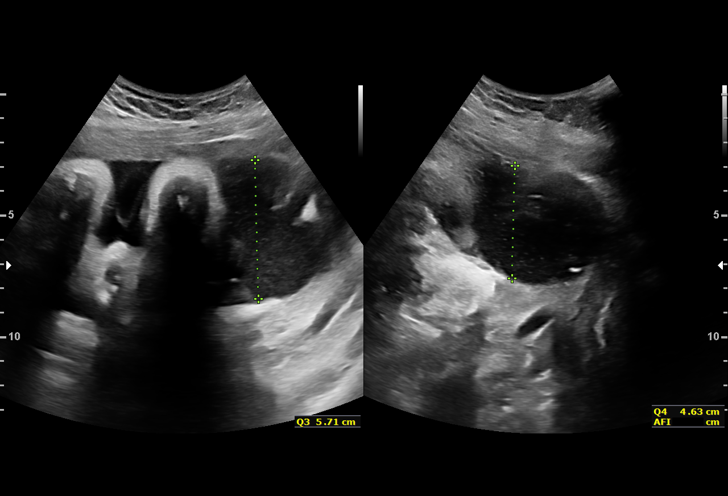
[im 23/44]
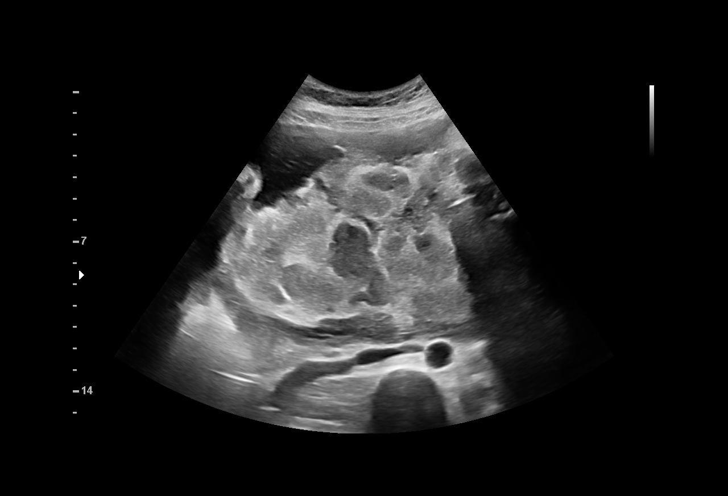
[im 24/44]
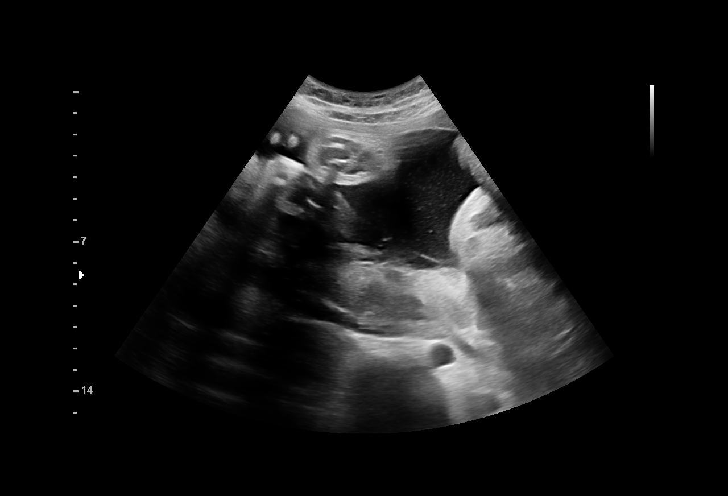
[im 28/44]
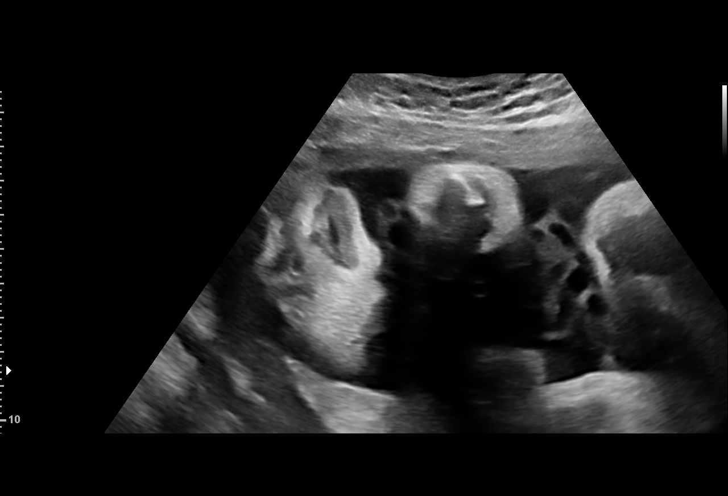
[im 31/44]
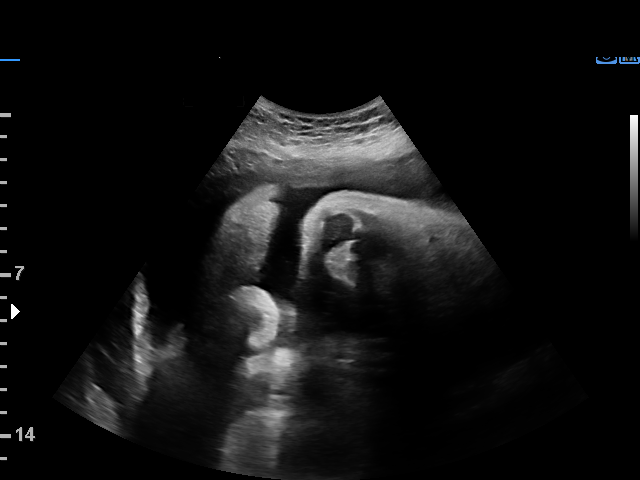
[im 34/44]
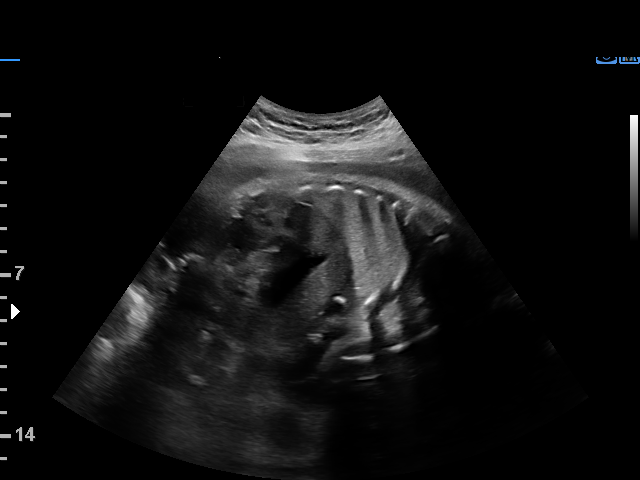
[im 37/44]
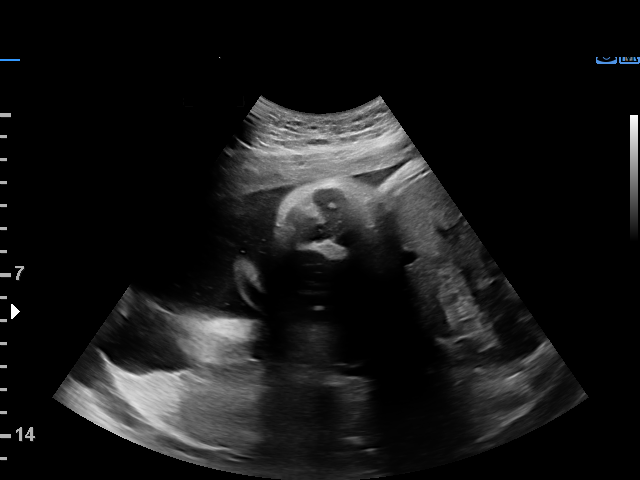
[im 40/44]
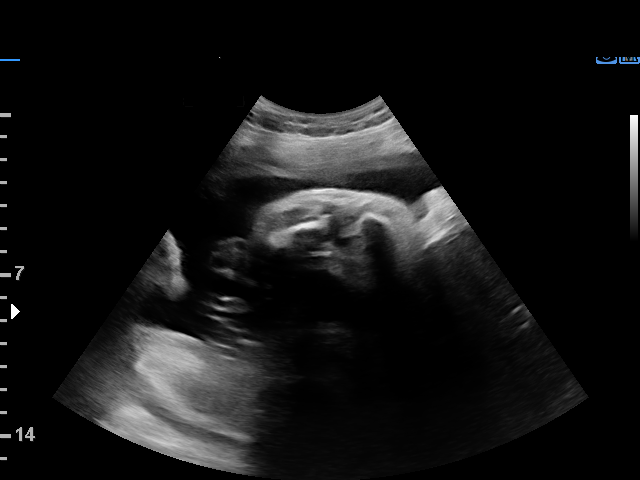
[im 44/44]
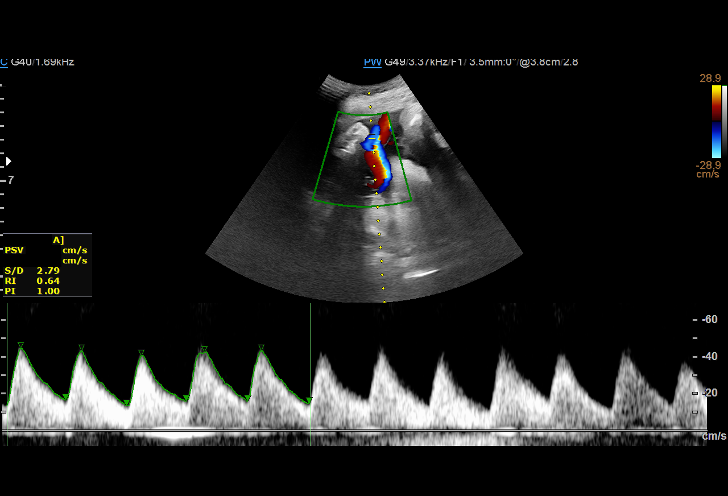

[15 of 28 positions shown; findings below may reference images not displayed]

[REDACTED]
                   [id number]

 ----------------------------------------------------------------------

 ----------------------------------------------------------------------
Indications

  36 weeks gestation of pregnancy
  Maternal care for known or suspected poor
  fetal growth, third trimester, fetus 1 IUGR
  35 weeks gestation of pregnancy
 ----------------------------------------------------------------------
Vital Signs

 BMI:
Fetal Evaluation

 Num Of Fetuses:         1
 Fetal Heart Rate(bpm):  140
 Cardiac Activity:       Observed
 Presentation:           Cephalic
 Placenta:               Posterior
 P. Cord Insertion:      Previously Visualized

 Amniotic Fluid
 AFI FV:      Within normal limits
 AFI Sum(cm)     %Tile       Largest Pocket(cm)
 20.29           77

 RUQ(cm)       RLQ(cm)       LUQ(cm)        LLQ(cm)

Biophysical Evaluation

 Amniotic F.V:   Within normal limits       F. Tone:        Observed
 F. Movement:    Observed                   Score:          [DATE]
 F. Breathing:   Observed
OB History

 Gravidity:    1         Term:   0        Prem:   0        SAB:   0
 TOP:          0       Ectopic:  0        Living: 0
Gestational Age

 LMP:           36w 4d        Date:  06/11/18                 EDD:   03/18/19
 Best:          36w 4d     Det. By:  LMP  (06/11/18)          EDD:   03/18/19
Anatomy

 Lips:                  Appears normal         Cord Vessels:           Appears normal (3
                                                                       vessel cord)
 Heart:                 Appears normal         Kidneys:                Appear normal
                        (4CH, axis, and
                        situs)
 Stomach:               Appears normal, left   Bladder:                Appears normal
                        sided
Doppler - Fetal Vessels

 Umbilical Artery
  S/D     %tile     RI              PI              PSV    ADFV    RDFV
                                                  (cm/s)
 2.97       82   0.66             1.06                43      No      No

Impression

 Known IUGR prior EFW 2%
 Good fetal movement and amniotic fluid
 Biophysical profile [DATE] Normal UA Doppler
Recommendations

 Given EFW < 3% recommend delivery by 37 weeks.

## 2021-10-15 ENCOUNTER — Other Ambulatory Visit: Payer: Self-pay

## 2021-10-15 ENCOUNTER — Inpatient Hospital Stay (HOSPITAL_COMMUNITY)
Admission: EM | Admit: 2021-10-15 | Discharge: 2021-10-18 | DRG: 872 | Disposition: A | Discharge status: Home / Self Care | Payer: Medicaid Other | Attending: Internal Medicine | Admitting: Internal Medicine

## 2021-10-15 ENCOUNTER — Emergency Department (HOSPITAL_COMMUNITY): Payer: Medicaid Other

## 2021-10-15 ENCOUNTER — Encounter (HOSPITAL_COMMUNITY): Payer: Self-pay

## 2021-10-15 DIAGNOSIS — Z331 Pregnant state, incidental: Secondary | ICD-10-CM | POA: Diagnosis present

## 2021-10-15 DIAGNOSIS — Z811 Family history of alcohol abuse and dependence: Secondary | ICD-10-CM

## 2021-10-15 DIAGNOSIS — A419 Sepsis, unspecified organism: Principal | ICD-10-CM | POA: Diagnosis present

## 2021-10-15 DIAGNOSIS — R7989 Other specified abnormal findings of blood chemistry: Secondary | ICD-10-CM | POA: Diagnosis present

## 2021-10-15 DIAGNOSIS — N10 Acute tubulo-interstitial nephritis: Secondary | ICD-10-CM | POA: Diagnosis present

## 2021-10-15 DIAGNOSIS — N12 Tubulo-interstitial nephritis, not specified as acute or chronic: Secondary | ICD-10-CM

## 2021-10-15 DIAGNOSIS — N289 Disorder of kidney and ureter, unspecified: Secondary | ICD-10-CM

## 2021-10-15 DIAGNOSIS — Z809 Family history of malignant neoplasm, unspecified: Secondary | ICD-10-CM

## 2021-10-15 DIAGNOSIS — K5900 Constipation, unspecified: Secondary | ICD-10-CM | POA: Diagnosis present

## 2021-10-15 DIAGNOSIS — J45909 Unspecified asthma, uncomplicated: Secondary | ICD-10-CM | POA: Diagnosis present

## 2021-10-15 DIAGNOSIS — R7303 Prediabetes: Secondary | ICD-10-CM | POA: Diagnosis present

## 2021-10-15 DIAGNOSIS — Z20822 Contact with and (suspected) exposure to covid-19: Secondary | ICD-10-CM | POA: Diagnosis present

## 2021-10-15 DIAGNOSIS — N179 Acute kidney failure, unspecified: Secondary | ICD-10-CM | POA: Diagnosis present

## 2021-10-15 DIAGNOSIS — N390 Urinary tract infection, site not specified: Secondary | ICD-10-CM | POA: Diagnosis present

## 2021-10-15 DIAGNOSIS — F17210 Nicotine dependence, cigarettes, uncomplicated: Secondary | ICD-10-CM | POA: Diagnosis present

## 2021-10-15 DIAGNOSIS — Z818 Family history of other mental and behavioral disorders: Secondary | ICD-10-CM

## 2021-10-15 DIAGNOSIS — R7401 Elevation of levels of liver transaminase levels: Secondary | ICD-10-CM | POA: Diagnosis present

## 2021-10-15 DIAGNOSIS — R0682 Tachypnea, not elsewhere classified: Secondary | ICD-10-CM | POA: Diagnosis present

## 2021-10-15 DIAGNOSIS — E876 Hypokalemia: Secondary | ICD-10-CM | POA: Diagnosis present

## 2021-10-15 DIAGNOSIS — E349 Endocrine disorder, unspecified: Secondary | ICD-10-CM | POA: Diagnosis present

## 2021-10-15 DIAGNOSIS — D649 Anemia, unspecified: Secondary | ICD-10-CM | POA: Diagnosis present

## 2021-10-15 DIAGNOSIS — Z8249 Family history of ischemic heart disease and other diseases of the circulatory system: Secondary | ICD-10-CM

## 2021-10-15 DIAGNOSIS — Z821 Family history of blindness and visual loss: Secondary | ICD-10-CM

## 2021-10-15 DIAGNOSIS — Z825 Family history of asthma and other chronic lower respiratory diseases: Secondary | ICD-10-CM

## 2021-10-15 DIAGNOSIS — E872 Acidosis, unspecified: Secondary | ICD-10-CM | POA: Diagnosis present

## 2021-10-15 DIAGNOSIS — R6520 Severe sepsis without septic shock: Secondary | ICD-10-CM | POA: Diagnosis present

## 2021-10-15 DIAGNOSIS — Z880 Allergy status to penicillin: Secondary | ICD-10-CM

## 2021-10-15 DIAGNOSIS — R748 Abnormal levels of other serum enzymes: Secondary | ICD-10-CM | POA: Diagnosis present

## 2021-10-15 DIAGNOSIS — F909 Attention-deficit hyperactivity disorder, unspecified type: Secondary | ICD-10-CM | POA: Diagnosis present

## 2021-10-15 DIAGNOSIS — Z720 Tobacco use: Secondary | ICD-10-CM | POA: Diagnosis present

## 2021-10-15 LAB — CBC WITH DIFFERENTIAL/PLATELET
Abs Immature Granulocytes: 0 10*3/uL (ref 0.00–0.07)
Band Neutrophils: 0 %
Basophils Absolute: 0 10*3/uL (ref 0.0–0.1)
Basophils Relative: 0 %
Blasts: 0 %
Eosinophils Absolute: 0 10*3/uL (ref 0.0–0.5)
Eosinophils Relative: 0 %
HCT: 37.8 % (ref 36.0–46.0)
Hemoglobin: 12.6 g/dL (ref 12.0–15.0)
Lymphocytes Relative: 5 %
Lymphs Abs: 0.9 10*3/uL (ref 0.7–4.0)
MCH: 34.2 pg — ABNORMAL HIGH (ref 26.0–34.0)
MCHC: 33.3 g/dL (ref 30.0–36.0)
MCV: 102.7 fL — ABNORMAL HIGH (ref 80.0–100.0)
Metamyelocytes Relative: 0 %
Monocytes Absolute: 2.6 10*3/uL — ABNORMAL HIGH (ref 0.1–1.0)
Monocytes Relative: 14 %
Myelocytes: 0 %
Neutro Abs: 14.9 10*3/uL — ABNORMAL HIGH (ref 1.7–7.7)
Neutrophils Relative %: 81 %
Other: 0 %
Platelets: 328 10*3/uL (ref 150–400)
Promyelocytes Relative: 0 %
RBC: 3.68 MIL/uL — ABNORMAL LOW (ref 3.87–5.11)
RDW: 12.6 % (ref 11.5–15.5)
WBC: 18.4 10*3/uL — ABNORMAL HIGH (ref 4.0–10.5)
nRBC: 0 % (ref 0.0–0.2)
nRBC: 0 /100 WBC

## 2021-10-15 LAB — PROTIME-INR
INR: 1 (ref 0.8–1.2)
Prothrombin Time: 13.3 seconds (ref 11.4–15.2)

## 2021-10-15 LAB — APTT: aPTT: 43 seconds — ABNORMAL HIGH (ref 24–36)

## 2021-10-15 LAB — COMPREHENSIVE METABOLIC PANEL
ALT: 35 U/L (ref 0–44)
AST: 37 U/L (ref 15–41)
Albumin: 3.2 g/dL — ABNORMAL LOW (ref 3.5–5.0)
Alkaline Phosphatase: 122 U/L (ref 38–126)
Anion gap: 12 (ref 5–15)
BUN: 11 mg/dL (ref 6–20)
CO2: 20 mmol/L — ABNORMAL LOW (ref 22–32)
Calcium: 8.8 mg/dL — ABNORMAL LOW (ref 8.9–10.3)
Chloride: 102 mmol/L (ref 98–111)
Creatinine, Ser: 1.26 mg/dL — ABNORMAL HIGH (ref 0.44–1.00)
GFR, Estimated: >60 mL/min (ref >60)
Glucose, Bld: 122 mg/dL — ABNORMAL HIGH (ref 70–99)
Potassium: 3.2 mmol/L — ABNORMAL LOW (ref 3.5–5.1)
Sodium: 134 mmol/L — ABNORMAL LOW (ref 135–145)
Total Bilirubin: 1.9 mg/dL — ABNORMAL HIGH (ref 0.3–1.2)
Total Protein: 8.2 g/dL — ABNORMAL HIGH (ref 6.5–8.1)

## 2021-10-15 LAB — URINALYSIS, ROUTINE W REFLEX MICROSCOPIC
Bilirubin Urine: NEGATIVE
Glucose, UA: NEGATIVE mg/dL
Ketones, ur: NEGATIVE mg/dL
Nitrite: NEGATIVE
Protein, ur: 100 mg/dL — AB
Specific Gravity, Urine: 1.021 (ref 1.005–1.030)
WBC, UA: >50 WBC/hpf — ABNORMAL HIGH (ref 0–5)
pH: 5 (ref 5.0–8.0)

## 2021-10-15 LAB — WET PREP, GENITAL
Clue Cells Wet Prep HPF POC: NONE SEEN
Sperm: NONE SEEN
Trich, Wet Prep: NONE SEEN
WBC, Wet Prep HPF POC: <10 (ref <10)
Yeast Wet Prep HPF POC: NONE SEEN

## 2021-10-15 LAB — RESP PANEL BY RT-PCR (FLU A&B, COVID) ARPGX2
Influenza A by PCR: NEGATIVE
Influenza B by PCR: NEGATIVE
SARS Coronavirus 2 by RT PCR: NEGATIVE

## 2021-10-15 LAB — HCG, QUANTITATIVE, PREGNANCY: hCG, Beta Chain, Quant, S: 168 m[IU]/mL — ABNORMAL HIGH (ref <5)

## 2021-10-15 LAB — I-STAT BETA HCG BLOOD, ED (MC, WL, AP ONLY): I-stat hCG, quantitative: 186.4 m[IU]/mL — ABNORMAL HIGH (ref <5)

## 2021-10-15 LAB — LACTIC ACID, PLASMA
Lactic Acid, Venous: 2.6 mmol/L (ref 0.5–1.9)
Lactic Acid, Venous: 1.5 mmol/L (ref 0.5–1.9)

## 2021-10-15 MED ORDER — SODIUM CHLORIDE 0.9 % IV BOLUS
1000.0000 mL | Freq: Once | INTRAVENOUS | Status: AC
  Administered 2021-10-15: 1000 mL via INTRAVENOUS

## 2021-10-15 MED ORDER — METRONIDAZOLE 500 MG/100ML IV SOLN: 500.0000 mg | Freq: Two times a day (BID) | INTRAVENOUS | Status: DC

## 2021-10-15 MED ORDER — FENTANYL CITRATE PF 50 MCG/ML IJ SOSY
50.0000 ug | PREFILLED_SYRINGE | Freq: Once | INTRAMUSCULAR | Status: AC
  Administered 2021-10-15: 50 ug via INTRAVENOUS
  Filled 2021-10-15: qty 1

## 2021-10-15 MED ORDER — ONDANSETRON HCL 4 MG/2ML IJ SOLN
4.0000 mg | Freq: Once | INTRAMUSCULAR | Status: AC
  Administered 2021-10-15: 4 mg via INTRAVENOUS
  Filled 2021-10-15: qty 2

## 2021-10-15 MED ORDER — FENTANYL CITRATE PF 50 MCG/ML IJ SOSY
50.0000 ug | PREFILLED_SYRINGE | INTRAMUSCULAR | Status: DC | PRN
  Administered 2021-10-15 – 2021-10-18 (×7): 50 ug via INTRAVENOUS
  Filled 2021-10-15 (×6): qty 1

## 2021-10-15 MED ORDER — IBUPROFEN 800 MG PO TABS
800.0000 mg | ORAL_TABLET | Freq: Once | ORAL | Status: AC
  Administered 2021-10-15: 800 mg via ORAL
  Filled 2021-10-15: qty 1

## 2021-10-15 MED ORDER — ACETAMINOPHEN 325 MG PO TABS
650.0000 mg | ORAL_TABLET | Freq: Once | ORAL | Status: AC
  Administered 2021-10-15: 650 mg via ORAL
  Filled 2021-10-15: qty 2

## 2021-10-15 MED ORDER — LACTATED RINGERS IV BOLUS (SEPSIS)
1000.0000 mL | Freq: Once | INTRAVENOUS | Status: AC
  Administered 2021-10-15: 1000 mL via INTRAVENOUS

## 2021-10-15 MED ORDER — LACTATED RINGERS IV BOLUS (SEPSIS): 1000.0000 mL | Freq: Once | INTRAVENOUS | Status: DC

## 2021-10-15 MED ORDER — LACTATED RINGERS IV BOLUS (SEPSIS)
500.0000 mL | Freq: Once | INTRAVENOUS | Status: AC
  Administered 2021-10-15: 500 mL via INTRAVENOUS

## 2021-10-15 MED ORDER — SODIUM CHLORIDE 0.9 % IV SOLN: 1.0000 g | Freq: Once | INTRAVENOUS | Status: DC

## 2021-10-15 MED ORDER — SODIUM CHLORIDE 0.9 % IV SOLN
2.0000 g | Freq: Three times a day (TID) | INTRAVENOUS | Status: DC
  Administered 2021-10-15 – 2021-10-18 (×9): 2 g via INTRAVENOUS
  Filled 2021-10-15 (×9): qty 12.5

## 2021-10-15 MED ORDER — FENTANYL CITRATE PF 50 MCG/ML IJ SOSY
PREFILLED_SYRINGE | INTRAMUSCULAR | Status: AC
  Filled 2021-10-15: qty 1

## 2021-10-15 NOTE — ED Provider Triage Note (Signed)
Emergency Medicine Provider Triage Evaluation Note  Kim Fritz , a 26 y.o. female  was evaluated in triage.  Pt complains of abdominal pain. Started last week on Monday. RLQ pain and radiates to back. Fever at home. Some diarrhea and constipation at times. Nauses but no vomiting. Poor appetite d/t nausea. Taken ibuprofen and tylenol for pain. Denies hematuria, blood stools, hemoptysis.  Review of Systems  Positive: See above Negative: See above  Physical Exam  BP 116/77 (BP Location: Right Arm)   Pulse (!) 112   Temp (!) 101.5 F (38.6 C) (Oral)   Resp 16   Ht 5' 5.5" (1.664 m)   Wt 76.7 kg   SpO2 100%   BMI 27.70 kg/m  Gen:   Awake, ill appearing Resp:  Normal effort MSK:   Moves extremities without difficulty  Other:  RLQ tenderness near mcburneys point  Medical Decision Making  Medically screening exam initiated at 6:57 PM.  Appropriate orders placed.  Kim Fritz was informed that the remainder of the evaluation will be completed by another provider, this initial triage assessment does not replace that evaluation, and the importance of remaining in the ED until their evaluation is complete.  Plan: sepsis labs and CT abdomen and pelvis   Harriet Pho, PA-C 10/15/21 [id number]

## 2021-10-15 NOTE — ED Notes (Addendum)
Pt in ct, per triage nt

## 2021-10-15 NOTE — ED Notes (Signed)
Patient transported to Ultrasound 

## 2021-10-15 NOTE — ED Notes (Signed)
Ambulates to restroom at this time without assist to provide urine sample

## 2021-10-15 NOTE — ED Provider Notes (Signed)
[facility] EMERGENCY DEPARTMENT Provider Note   CSN: [id number] Arrival date & time: 10/15/21  1825     History {Add pertinent medical, surgical, social history, OB history to HPI:1} Chief Complaint  Patient presents with  . Fever  . Abdominal Pain    Kim Fritz is a 26 y.o. female who presents for evaluation of fever and abdominal pain.  Patient reports onset of constipation 8 days ago.  She states that she did not have a bowel movement for 4 days.  She began running a fever during that time.  She has had shaking chills and "very hot skin" for the past week.  She states that her shaking chills improved today but she started having severe pain and was seen at an urgent care where she was thought to have appendicitis, sent in for further evaluation.  She denies urinary symptoms vaginal symptoms flank pain.  LMP September 16, 2021.  Review of outside records from [facility] shows patient had a negative point-of-care COVID test as well as a negative flu test.  Point-of-care flu test at her outpatient   Fever Abdominal Pain Associated symptoms: fever        Home Medications Prior to Admission medications   Medication Sig Start Date End Date Taking? Authorizing Provider  acetaminophen (TYLENOL) 325 MG tablet Take 2 tablets (650 mg total) by mouth every 6 (six) hours as needed (for pain scale < 4). 02/27/19   Fair, Marin Shutter, MD  Blood Pressure Monitor KIT 1 kit by Does not apply route once a week. Check BP Weekly. Large Cuff DX O90.0 09/24/18   Shelly Bombard, MD  Blood Pressure Monitoring (BLOOD PRESSURE KIT) DEVI 1 kit by Does not apply route once a week. Check BP Weekly.  Large Cuff.  DX: Z13.6  Z34.86  O09.0 10/14/18   Clarnce Flock, MD  Elastic Bandages & Supports (COMFORT FIT MATERNITY SUPP SM) MISC Wear as directed. 01/12/19   Shelly Bombard, MD  ferrous sulfate 325 (65 FE) MG tablet Take 1 tablet (325 mg total) by mouth every other day. 02/27/19    Fair, Marin Shutter, MD  ibuprofen (ADVIL) 600 MG tablet Take 1 tablet (600 mg total) by mouth every 6 (six) hours. 02/27/19   Fair, Marin Shutter, MD  Prenatal Vit-Fe Fumarate-FA (PRENATAL MULTIVITAMIN) TABS tablet Take 1 tablet by mouth daily at 12 noon.    [provider]      Allergies    Penicillins    Review of Systems   Review of Systems  Constitutional:  Positive for fever.  Gastrointestinal:  Positive for abdominal pain.    Physical Exam Updated Vital Signs BP 116/77 (BP Location: Right Arm)   Pulse (!) 112   Temp (!) 102.6 F (39.2 C) (Oral)   Resp 16   Ht 5' 5.5" (1.664 m)   Wt 76.7 kg   SpO2 100%   BMI 27.70 kg/m  Physical Exam Vitals and nursing note reviewed.  Constitutional:      General: She is not in acute distress.    Appearance: She is well-developed. She is not diaphoretic.  HENT:     Head: Normocephalic and atraumatic.     Right Ear: External ear normal.     Left Ear: External ear normal.     Nose: Nose normal.     Mouth/Throat:     Mouth: Mucous membranes are moist.  Eyes:     General: Scleral icterus present.  Conjunctiva/sclera: Conjunctivae normal.  Cardiovascular:     Rate and Rhythm: Normal rate and regular rhythm.     Heart sounds: Normal heart sounds. No murmur heard.    No friction rub. No gallop.  Pulmonary:     Effort: Pulmonary effort is normal. Tachypnea present. No respiratory distress.     Breath sounds: Normal breath sounds.     Comments: Patient is tachypneic due to splinting from abdominal pain. Abdominal:     General: Bowel sounds are normal. There is no distension.     Palpations: Abdomen is soft. There is no mass.     Tenderness: There is abdominal tenderness in the right upper quadrant and periumbilical area. There is rebound.  Musculoskeletal:     Cervical back: Normal range of motion.  Skin:    General: Skin is warm and dry.  Neurological:     Mental Status: She is alert and oriented to person, place, and time.   Psychiatric:        Behavior: Behavior normal.     ED Results / Procedures / Treatments   Labs (all labs ordered are listed, but only abnormal results are displayed) Labs Reviewed  COMPREHENSIVE METABOLIC PANEL - Abnormal; Notable for the following components:      Result Value   Sodium 134 (*)    Potassium 3.2 (*)    CO2 20 (*)    Glucose, Bld 122 (*)    Creatinine, Ser 1.26 (*)    Calcium 8.8 (*)    Total Protein 8.2 (*)    Albumin 3.2 (*)    Total Bilirubin 1.9 (*)    All other components within normal limits  CBC WITH DIFFERENTIAL/PLATELET - Abnormal; Notable for the following components:   WBC 18.4 (*)    RBC 3.68 (*)    MCV 102.7 (*)    MCH 34.2 (*)    Neutro Abs 14.9 (*)    Monocytes Absolute 2.6 (*)    All other components within normal limits  I-STAT BETA HCG BLOOD, ED ([facility], [facility], [facility] ONLY) - Abnormal; Notable for the following components:   I-stat hCG, quantitative 186.4 (*)    All other components within normal limits  CULTURE, BLOOD (ROUTINE X 2)  CULTURE, BLOOD (ROUTINE X 2)  PROTIME-INR  LACTIC ACID, PLASMA  LACTIC ACID, PLASMA  URINALYSIS, ROUTINE W REFLEX MICROSCOPIC  HCG, QUANTITATIVE, PREGNANCY    EKG None  Radiology US Abdomen Limited RUQ (LIVER/GB)  Result Date: 10/15/2021 CLINICAL DATA:  Right upper quadrant tenderness EXAM: ULTRASOUND ABDOMEN LIMITED RIGHT UPPER QUADRANT COMPARISON:  None Available. FINDINGS: Gallbladder: No gallstones or wall thickening visualized. No sonographic Murphy sign noted by sonographer. Common bile duct: Diameter: 2 mm Liver: No focal lesion identified. Within normal limits in parenchymal echogenicity. Portal vein is patent on color Doppler imaging with normal direction of blood flow towards the liver. Other: None. IMPRESSION: 1. Unremarkable right upper quadrant ultrasound. Electronically Signed   By: Randa Ngo M.D.   On: 10/15/2021 20:05   DG Chest 2 View  Result Date: 10/15/2021 CLINICAL DATA:  Suspected  Sepsis. Triage notes:"Pt reports RLQ pain, chills, and some mild diarrhea for the past couple of days. Was sent to ED from [facility] for concerns of appendicitis. Denies any urinary symptoms. " EXAM: CHEST - 2 VIEW COMPARISON:  None Available. FINDINGS: The heart and mediastinal contours are within normal limits. No focal consolidation. No pulmonary edema. No pleural effusion. No pneumothorax. No acute osseous abnormality. IMPRESSION: No active cardiopulmonary  disease. Electronically Signed   By: Iven Finn M.D.   On: 10/15/2021 19:28    Procedures Procedures  {Document cardiac monitor, telemetry assessment procedure when appropriate:1}  Medications Ordered in ED Medications  ibuprofen (ADVIL) tablet 800 mg (has no administration in time range)  fentaNYL (SUBLIMAZE) injection 50 mcg (has no administration in time range)  ondansetron (ZOFRAN) injection 4 mg (has no administration in time range)  sodium chloride 0.9 % bolus 1,000 mL (1,000 mLs Intravenous New Bag/Given 10/15/21 2026)    ED Course/ Medical Decision Making/ A&P Clinical Course as of 10/15/21 2114  Tue Oct 15, 5972  [id number] 26 year old female who presents the emergency department with fever and abdominal pain.  On my examination she appears to be icteric with right upper quadrant abdominal pain, rebound tenderness. The emergent DDX for RUQ pain includes but is not limited to Glabladder disease, PUD, Acute Hepatitis, Pancreatitis, pyelonephritis, Pneumonia, Lower lobe PE/Infarct, Kidney stone, GERD, retrocecal appendicitis, Fitz-Hugh-Curtis syndrome, AAA, MI, Zoster.  I have less suspicion for appendicitis given her appearance.  CT and right upper quadrant ultrasound ordered.  I have ordered pain medication.  I have ordered Advil for fever reduction. [AH]  2010 US Abdomen Limited RUQ (LIVER/GB) Patient's right upper quadrant ultrasound I visualized and interpreted.  No acute findings. [AH]  2010 Comprehensive metabolic panel(!) I  reviewed the patient's CMP.  She has an AKI with a creatinine of 1.26 but appears to have had elevated creatinines in the past. Patient also has a mildly elevated total bilirubin with otherwise normal hepatic enzymes.  Question  hemolytic etiology. Doubt Gilbert's syndrome as patient had normal enzymes in the past [AH]  2013 I-stat hCG, quantitative(!): 186.4 Patient's i-STAT hCG is slightly elevated.  I have ordered a quantitative hCG. ? Pelvic process Differential considerations include pelvic inflammatory disease, ectopic pregnancy, appendicitis, urinary calculi, primary dysmenorrhea, septic abortion, ruptured ovarian cyst or tumor, ovarian torsion, tubo-ovarian abscess, degeneration of fibroid, endometriosis, diverticulitis, cystitis.  [AH]  2013 Comment 3:        [BS]  2027 WBC(!): 18.4 [AH]    Clinical Course User Index [AH] Margarita Mail, PA-C [BS] Small, Si Gaul, PA                           Medical Decision Making Amount and/or Complexity of Data Reviewed Labs: ordered. Radiology: ordered.  Risk Prescription drug management.   ***  {Document critical care time when appropriate:1} {Document review of labs and clinical decision tools ie heart score, Chads2Vasc2 etc:1}  {Document your independent review of radiology images, and any outside records:1} {Document your discussion with family members, caretakers, and with consultants:1} {Document social determinants of health affecting pt's care:1} {Document your decision making why or why not admission, treatments were needed:1} Final Clinical Impression(s) / ED Diagnoses Final diagnoses:  None    Rx / DC Orders ED Discharge Orders     None

## 2021-10-15 NOTE — ED Notes (Signed)
Per Dr. Doren Custard patinet should wait to go to CT scan until further notice.

## 2021-10-15 NOTE — ED Notes (Signed)
Lab called to report lactic of 2.6. EDP notified

## 2021-10-15 NOTE — ED Triage Notes (Addendum)
Pt reports RLQ pain, chills, and some mild diarrhea for the past couple of days. Was sent to ED from [facility] for concerns of appendicitis. Denies any urinary symptoms.

## 2021-10-15 NOTE — Progress Notes (Addendum)
Pharmacy Antibiotic Note  Kim Fritz is a 26 y.o. female admitted on 10/15/2021 with  intra-abdominal infection /sepsis.  Pharmacy has been consulted for Zosyn dosing. Patient has PCN allergy and has been switched to cefepime and flagyl. Patient reported with right lower quadrant pain, chills, and mild diarrhea for the past couple days and was sent to the ED for concerns of appendicitis. WBC elevated at 18.4, lactic acid 2.6,  Fever of 102.6, and Scr 1.26 (bl~ unknown).   Per discussion with ED provider will hold flagyl for anaerobic coverage for now.  Plan: Cefepime 2g IV q8h Monitor for signs of clinical improvement, fever curve, cultures, and WBC.  F/u addition of anaerobic coverage if true intra-abdominal infection; recommend cefoxitin.  Monitor signs for clinical improvement Height: 5' 5.5" (166.4 cm) Weight: 76.7 kg (169 lb) IBW/kg (Calculated) : 58.15  Temp (24hrs), Avg:101.1 F (38.4 C), Min:99.3 F (37.4 C), Max:102.6 F (39.2 C)  Recent Labs  Lab 10/15/21 1851 10/15/21 1914  WBC  --  18.4*  CREATININE  --  1.26*  LATICACIDVEN 2.6*  --     Estimated Creatinine Clearance: 69.5 mL/min (A) (by C-G formula based on SCr of 1.26 mg/dL (H)).    Allergies  Allergen Reactions   Penicillins Hives and Itching    Did it involve swelling of the face/tongue/throat, SOB, or low BP? No Did it involve sudden or severe rash/hives, skin peeling, or any reaction on the inside of your mouth or nose? No Did you need to seek medical attention at a hospital or doctor's office? Yes When did it last happen? 2013      If all above answers are "NO", may proceed with cephalosporin use.     Antimicrobials this admission: Cefepime 9/19 >>   Microbiology results: 9/19 BCx:   Thank you for allowing pharmacy to be a part of this patient's care.  Sandford Craze, PharmD. [facility] Acute Care PGY-1  10/15/2021 8:48 PM

## 2021-10-16 ENCOUNTER — Emergency Department (HOSPITAL_COMMUNITY): Payer: Medicaid Other

## 2021-10-16 ENCOUNTER — Encounter (HOSPITAL_COMMUNITY): Payer: Self-pay | Admitting: Internal Medicine

## 2021-10-16 DIAGNOSIS — Z20822 Contact with and (suspected) exposure to covid-19: Secondary | ICD-10-CM | POA: Diagnosis present

## 2021-10-16 DIAGNOSIS — E349 Endocrine disorder, unspecified: Secondary | ICD-10-CM | POA: Diagnosis present

## 2021-10-16 DIAGNOSIS — F17210 Nicotine dependence, cigarettes, uncomplicated: Secondary | ICD-10-CM | POA: Diagnosis present

## 2021-10-16 DIAGNOSIS — R748 Abnormal levels of other serum enzymes: Secondary | ICD-10-CM | POA: Diagnosis present

## 2021-10-16 DIAGNOSIS — N179 Acute kidney failure, unspecified: Secondary | ICD-10-CM | POA: Diagnosis present

## 2021-10-16 DIAGNOSIS — R7401 Elevation of levels of liver transaminase levels: Secondary | ICD-10-CM | POA: Diagnosis present

## 2021-10-16 DIAGNOSIS — R7989 Other specified abnormal findings of blood chemistry: Secondary | ICD-10-CM | POA: Diagnosis present

## 2021-10-16 DIAGNOSIS — J45909 Unspecified asthma, uncomplicated: Secondary | ICD-10-CM | POA: Diagnosis present

## 2021-10-16 DIAGNOSIS — R6520 Severe sepsis without septic shock: Secondary | ICD-10-CM

## 2021-10-16 DIAGNOSIS — F909 Attention-deficit hyperactivity disorder, unspecified type: Secondary | ICD-10-CM | POA: Diagnosis present

## 2021-10-16 DIAGNOSIS — E876 Hypokalemia: Secondary | ICD-10-CM | POA: Diagnosis present

## 2021-10-16 DIAGNOSIS — Z720 Tobacco use: Secondary | ICD-10-CM | POA: Diagnosis present

## 2021-10-16 DIAGNOSIS — A419 Sepsis, unspecified organism: Secondary | ICD-10-CM | POA: Diagnosis present

## 2021-10-16 DIAGNOSIS — D649 Anemia, unspecified: Secondary | ICD-10-CM | POA: Diagnosis present

## 2021-10-16 DIAGNOSIS — Z821 Family history of blindness and visual loss: Secondary | ICD-10-CM | POA: Diagnosis not present

## 2021-10-16 DIAGNOSIS — N289 Disorder of kidney and ureter, unspecified: Secondary | ICD-10-CM

## 2021-10-16 DIAGNOSIS — Z825 Family history of asthma and other chronic lower respiratory diseases: Secondary | ICD-10-CM | POA: Diagnosis not present

## 2021-10-16 DIAGNOSIS — N390 Urinary tract infection, site not specified: Secondary | ICD-10-CM | POA: Diagnosis present

## 2021-10-16 DIAGNOSIS — Z811 Family history of alcohol abuse and dependence: Secondary | ICD-10-CM | POA: Diagnosis not present

## 2021-10-16 DIAGNOSIS — Z818 Family history of other mental and behavioral disorders: Secondary | ICD-10-CM | POA: Diagnosis not present

## 2021-10-16 DIAGNOSIS — N10 Acute tubulo-interstitial nephritis: Secondary | ICD-10-CM

## 2021-10-16 DIAGNOSIS — Z809 Family history of malignant neoplasm, unspecified: Secondary | ICD-10-CM | POA: Diagnosis not present

## 2021-10-16 DIAGNOSIS — R7303 Prediabetes: Secondary | ICD-10-CM | POA: Diagnosis present

## 2021-10-16 DIAGNOSIS — K5900 Constipation, unspecified: Secondary | ICD-10-CM | POA: Diagnosis present

## 2021-10-16 DIAGNOSIS — Z8249 Family history of ischemic heart disease and other diseases of the circulatory system: Secondary | ICD-10-CM | POA: Diagnosis not present

## 2021-10-16 DIAGNOSIS — Z880 Allergy status to penicillin: Secondary | ICD-10-CM | POA: Diagnosis not present

## 2021-10-16 DIAGNOSIS — E872 Acidosis, unspecified: Secondary | ICD-10-CM | POA: Diagnosis present

## 2021-10-16 LAB — COMPREHENSIVE METABOLIC PANEL
ALT: 49 U/L — ABNORMAL HIGH (ref 0–44)
AST: 72 U/L — ABNORMAL HIGH (ref 15–41)
Albumin: 2.7 g/dL — ABNORMAL LOW (ref 3.5–5.0)
Alkaline Phosphatase: 147 U/L — ABNORMAL HIGH (ref 38–126)
Anion gap: 9 (ref 5–15)
BUN: 9 mg/dL (ref 6–20)
CO2: 19 mmol/L — ABNORMAL LOW (ref 22–32)
Calcium: 8.6 mg/dL — ABNORMAL LOW (ref 8.9–10.3)
Chloride: 106 mmol/L (ref 98–111)
Creatinine, Ser: 0.82 mg/dL (ref 0.44–1.00)
GFR, Estimated: >60 mL/min (ref >60)
Glucose, Bld: 105 mg/dL — ABNORMAL HIGH (ref 70–99)
Potassium: 4 mmol/L (ref 3.5–5.1)
Sodium: 134 mmol/L — ABNORMAL LOW (ref 135–145)
Total Bilirubin: 2.3 mg/dL — ABNORMAL HIGH (ref 0.3–1.2)
Total Protein: 6.9 g/dL (ref 6.5–8.1)

## 2021-10-16 LAB — LIPASE, BLOOD: Lipase: 23 U/L (ref 11–51)

## 2021-10-16 LAB — HIV ANTIBODY (ROUTINE TESTING W REFLEX): HIV Screen 4th Generation wRfx: NONREACTIVE

## 2021-10-16 MED ORDER — ACETAMINOPHEN 325 MG PO TABS
650.0000 mg | ORAL_TABLET | Freq: Four times a day (QID) | ORAL | Status: DC | PRN
  Administered 2021-10-16 (×2): 650 mg via ORAL
  Filled 2021-10-16 (×2): qty 2

## 2021-10-16 MED ORDER — ENOXAPARIN SODIUM 40 MG/0.4ML IJ SOSY
40.0000 mg | PREFILLED_SYRINGE | INTRAMUSCULAR | Status: DC
  Administered 2021-10-16 – 2021-10-17 (×2): 40 mg via SUBCUTANEOUS
  Filled 2021-10-16 (×3): qty 0.4

## 2021-10-16 MED ORDER — ACETAMINOPHEN 650 MG RE SUPP: 650.0000 mg | Freq: Four times a day (QID) | RECTAL | Status: DC | PRN

## 2021-10-16 MED ORDER — HYDROCODONE-ACETAMINOPHEN 5-325 MG PO TABS
1.0000 | ORAL_TABLET | Freq: Four times a day (QID) | ORAL | Status: DC | PRN
  Administered 2021-10-16 – 2021-10-18 (×5): 1 via ORAL
  Filled 2021-10-16 (×5): qty 1

## 2021-10-16 MED ORDER — SODIUM CHLORIDE 0.9 % IV SOLN: INTRAVENOUS | Status: DC

## 2021-10-16 MED ORDER — ONDANSETRON HCL 4 MG PO TABS: 4.0000 mg | ORAL_TABLET | Freq: Four times a day (QID) | ORAL | Status: DC | PRN

## 2021-10-16 MED ORDER — SODIUM CHLORIDE 0.9% FLUSH
3.0000 mL | Freq: Two times a day (BID) | INTRAVENOUS | Status: DC
  Administered 2021-10-17 – 2021-10-18 (×2): 3 mL via INTRAVENOUS

## 2021-10-16 MED ORDER — ALBUTEROL SULFATE (2.5 MG/3ML) 0.083% IN NEBU: 2.5000 mg | INHALATION_SOLUTION | Freq: Four times a day (QID) | RESPIRATORY_TRACT | Status: DC | PRN

## 2021-10-16 MED ORDER — POTASSIUM CHLORIDE CRYS ER 20 MEQ PO TBCR
40.0000 meq | EXTENDED_RELEASE_TABLET | ORAL | Status: AC
  Administered 2021-10-16: 40 meq via ORAL
  Filled 2021-10-16: qty 2

## 2021-10-16 MED ORDER — ONDANSETRON HCL 4 MG/2ML IJ SOLN: 4.0000 mg | Freq: Four times a day (QID) | INTRAMUSCULAR | Status: DC | PRN

## 2021-10-16 MED ORDER — NICOTINE 14 MG/24HR TD PT24
14.0000 mg | MEDICATED_PATCH | Freq: Every day | TRANSDERMAL | Status: DC
  Administered 2021-10-16 – 2021-10-17 (×2): 14 mg via TRANSDERMAL
  Filled 2021-10-16 (×3): qty 1

## 2021-10-16 NOTE — H&P (Signed)
History and Physical    PatientJaelani Fritz PFX:[id number] DOB: Mar 22, 1995 DOA: 10/15/2021 DOS: the patient was seen and examined on 10/16/2021 PCP: Patient, [name]  Patient coming from: Home  Chief Complaint:  Chief Complaint  Patient presents with   Fever   Abdominal Pain   HPI: Kim Fritz is a 26 y.o. female with medical history significant of asthma, anemia, prediabetes, and ADHD who presented with complaints of abdominal pain. Symptoms started 9 days ago with right lower quadrant pain. Patient developed chills, subjective fever, generalized body aches, nausea, and constipation.  She thought symptoms were possibly related to constipation, but she reported having loose stools for the last 2 days.  Having any significant cough, sore throat, vomiting, dysuria, or urinary frequency symptoms.  Patient noted that someone in her son's class had COVID-19 although her son had no symptoms she had went  to [facility] to be evaluated yesterday.  There she was found to be negative for influenza and COVID.  However, due to her symptoms she was recommended to come to the emergency department for further evaluation as there was concern for the possibility of appendicitis.  Patient notes her last menstrual cycle started August 21.  Denies being on birth control at this time.  She does smoke half a pack cigarettes per day on average and drinks alcohol, but usually only on the weekends.  Upon admission into the emergency department patient was noted to be febrile up to 102.6 F with tachycardia and tachypnea meeting SIRS criteria.  Labs from 9/19 significant for WBC 18.4, sodium 134, potassium 3.2, BUN 11, creatinine 1.26, lipase 23, total bilirubin 1.9, i-STAT hCG 186.4, quantitative  hCG 168, and lactic acid 2.6-> 1.5.  Chest x-ray did not note any acute abnormality.  Urinalysis noted small hemoglobin, moderate leukocytes, few bacteria, 21-50 RBCs/hpf, and greater than 50 WBCs.  Wet prep is negative.  Influenza  COVID-19 screening negative.  Patient underwent extensive evaluation including right upper quadrant ultrasound, transvaginal ultrasound, and MRI of the abdomen/pelvis which noted concern for pyelonephritis of the right kidney.  There were no signs of a intrauterine pregnancy.  Blood and urine cultures were obtained.  Patient was given bolus 1.5 L of lactated Ringer's, Tylenol, ibuprofen, Zofran, and cefepime IV  Review of Systems: As mentioned in the history of present illness. All other systems reviewed and are negative. Past Medical History:  Diagnosis Date   ADHD (attention deficit hyperactivity disorder)    Anemia    Asthma    Headache    Insomnia    Pre-diabetes    History reviewed. No pertinent surgical history. Social History:  reports that she has been smoking cigarettes. She has been smoking an average of .5 packs per day. She has never used smokeless tobacco. She reports current alcohol use. She reports that she does not use drugs.  Allergies  Allergen Reactions   Penicillins Hives and Itching    Did it involve swelling of the face/tongue/throat, SOB, or low BP? No Did it involve sudden or severe rash/hives, skin peeling, or any reaction on the inside of your mouth or nose? No Did you need to seek medical attention at a hospital or doctor's office? Yes When did it last happen? 2013      If all above answers are "NO", may proceed with cephalosporin use.     Family History  Problem Relation Age of Onset   ADD / ADHD Mother    Alcohol abuse Mother  Alcohol abuse Father    Hypertension Sister    ADD / ADHD Sister    Asthma Maternal Aunt    Cancer Maternal Aunt    ADD / ADHD Maternal Grandmother    Vision loss Maternal Grandmother     Prior to Admission medications   Medication Sig Start Date End Date Taking? Authorizing Provider  acetaminophen (TYLENOL) 325 MG tablet Take 2 tablets (650 mg total) by mouth every 6 (six) hours as needed (for pain scale < 4). 02/27/19   Yes Fair, Hoyle Sauer, MD    Physical Exam: Vitals:   10/16/21 0310 10/16/21 0450 10/16/21 0500 10/16/21 0630  BP: 121/72 108/63 (!) 112/90 114/74  Pulse: (!) 102 (!) 105 90 89  Resp: (!) 21 (!) 26 (!) 25 (!) 27  Temp:   98.9 F (37.2 C)   TempSrc:   Oral   SpO2: 100% 100% 97% 100%  Weight:      Height:       Exam  Constitutional: Young adult female who appears to feel unwell but in no acute distress Eyes: PERRL, lids and conjunctivae normal ENMT: Mucous membranes are moist.   Neck: normal, supple  Respiratory: clear to auscultation bilaterally, no wheezing, no crackles. Normal respiratory effort. No accessory muscle use.  Cardiovascular: Regular rate and rhythm, no murmurs / rubs / gallops. No extremity edema. Abdomen: Mild right lower quadrant tenderness to palpation.  Bowel sounds positive.  Musculoskeletal: no clubbing / cyanosis. No joint deformity upper and lower extremities. Good ROM, no contractures. Normal muscle tone.  Skin: no rashes, lesions, ulcers. No induration Neurologic: CN 2-12 grossly intact. Sensation intact, DTR normal. Strength 5/5 in all 4.  Psychiatric: Normal judgment and insight. Alert and oriented x 3. Normal mood.   Data Reviewed:  Reviewed labs, imaging, and pertinent records as noted above in HPI.  Assessment and Plan:  Sepsis secondary to pyelonephritis Acute.  Patient presented with complaints of abdominal pain found to have fever up to 102.6 F, tachycardia, tachycardia, and leukocytosis meeting SIRS criteria.  Urinalysis concerning for infection and imaging noting signs of a right-sided pyelonephritis.  Blood and urine cultures have been obtained and she was started on empiric antibiotics of cefepime.  Repeat lactic acid within normal limits at 1.5. -Admit to a telemetry bed -Follow-up blood and urine cultures -Continue cefepime and de-escalate when medically appropriate -Tylenol as needed for fever  Hypokalemia Acute.  Initial potassium  3.2.  Possibly related to reports of diarrhea. -Give 40 mEq of potassium chloride p.o. -Recheck potassium later today.  Continue to monitor and replace as needed  Renal insufficiency Acute.  Creatinine 1.26 with BUN 11.  Last available creatinine noted to be 1.1 back in 2013. -Continue normal saline IV fluids at 75 mL/h -Continue to monitor kidney function  Elevated serum hCG Acute.  On admission initial i-STAT hCG 186.4 and follow-up quantitative hCG 168.  Transvaginal ultrasound noted a 1.8 cm probable corpus luteum of the right ovary also noted on MRI, but no gestational sac was seen.  Question possibility of early pregnancy/pregnancy of unknown location or failed pregnancy. -Discussed with Dr. Ashok Pall on-call OB/GYN and patient needs a recheck beta-hCG in 48 hours.  If patient is discharged [facility] clinic should contact her to arrange a follow-up check.  Otherwise patient can come to [facility] on Friday to have labs done.  Hyperbilirubinemia Acute.  Total bilirubin was initially noted to be elevated at 1.9.  Right upper quadrant ultrasound did not note any acute abnormalities  and nor MRI of the abdomen and pelvis. -Recheck later this after noon  Tobacco use Patient reports smoking half pack cigarettes per day on average.  Counseled the patient on need of cessation of tobacco use. -Nicotine patch offered  DVT prophylaxis: Lovenox Advance Care Planning:   Code Status: Full Code  Consults: Telephone conversation to discuss elevated beta-hCG with OB/GYN  Family Communication: None requested  Severity of Illness: The appropriate patient status for this patient is INPATIENT. Inpatient status is judged to be reasonable and necessary in order to provide the required intensity of service to ensure the patient's safety. The patient's presenting symptoms, physical exam findings, and initial radiographic and laboratory data in the context of their chronic comorbidities is felt to place them at high risk for  further clinical deterioration. Furthermore, it is not anticipated that the patient will be medically stable for discharge from the hospital within 2 midnights of admission.   * I certify that at the point of admission it is my clinical judgment that the patient will require inpatient hospital care spanning beyond 2 midnights from the point of admission due to high intensity of service, high risk for further deterioration and high frequency of surveillance required.*  Author: Clydie Braun, MD 10/16/2021 7:26 AM  For on call review [email or web address].

## 2021-10-16 NOTE — ED Provider Notes (Signed)
Patient was received at shift change from Margarita Mail, PA-C please see her note for full detail  Without medical history presents with complaints of constipation and abdominal pain, states about 8 days ago she was constipated, last about 4 days now she has small bowel movements most which is diarrhea, denies melena or hematochezia, she states that then she started develop some stomach pains mainly in her lower abdomen with associated fevers and chills.  She denies any nausea vomiting, no urinary symptoms, no vaginal discharge or vaginal bleeding, no stomach surgeries, last menstrual cycle was August 21, she is not on birth control, she is having no other complaints.   Previous provider activated code sepsis, likely pyelonephritis, patient was given lactated Ringer's, fentanyl for pain, Zofran for nausea, it was started on cefepime as patient has a positive HCG.  Per previous provider follow-up on ultrasound and admit to medicine for sepsis.    Physical Exam  BP (!) 112/90   Pulse 90   Temp 98.9 F (37.2 C) (Oral)   Resp (!) 25   Ht 5' 5.5" (1.664 m)   Wt 76.7 kg   SpO2 97%   BMI 27.70 kg/m   Physical Exam Vitals and nursing note reviewed.  Constitutional:      General: She is not in acute distress.    Appearance: She is not ill-appearing.  HENT:     Head: Normocephalic and atraumatic.     Nose: No congestion.  Eyes:     Conjunctiva/sclera: Conjunctivae normal.  Cardiovascular:     Rate and Rhythm: Normal rate and regular rhythm.     Pulses: Normal pulses.     Heart sounds: No murmur heard.    No friction rub. No gallop.  Pulmonary:     Effort: No respiratory distress.     Breath sounds: No wheezing, rhonchi or rales.  Abdominal:     Palpations: Abdomen is soft.     Tenderness: There is abdominal tenderness. There is no right CVA tenderness or left CVA tenderness.     Comments: Abdomen is nondistended, soft, slight tenderness noted in the epigastric/right upper quadrant,  without guarding rebound tenderness or peritoneal sign negative Murphy sign McBurney point.  Skin:    General: Skin is warm and dry.  Neurological:     Mental Status: She is alert.  Psychiatric:        Mood and Affect: Mood normal.     Procedures  Procedures  ED Course / MDM   Clinical Course as of 10/16/21 0545  Tue Sep 19, 970  [id number] 26 year old female who presents the emergency department with fever and abdominal pain.  On my examination she appears to be icteric with right upper quadrant abdominal pain, rebound tenderness. The emergent DDX for RUQ pain includes but is not limited to Glabladder disease, PUD, Acute Hepatitis, Pancreatitis, pyelonephritis, Pneumonia, Lower lobe PE/Infarct, Kidney stone, GERD, retrocecal appendicitis, Fitz-Hugh-Curtis syndrome, AAA, MI, Zoster.  I have less suspicion for appendicitis given her appearance.  CT and right upper quadrant ultrasound ordered.  I have ordered pain medication.  I have ordered Advil for fever reduction. [AH]  2010 US Abdomen Limited RUQ (LIVER/GB) Patient's right upper quadrant ultrasound I visualized and interpreted.  No acute findings. [AH]  2010 Comprehensive metabolic panel(!) I reviewed the patient's CMP.  She has an AKI with a creatinine of 1.26 but appears to have had elevated creatinines in the past. Patient also has a mildly elevated total bilirubin with otherwise normal hepatic enzymes.  Question  hemolytic etiology. Doubt Gilbert's syndrome as patient had normal enzymes in the past [AH]  2013 I-stat hCG, quantitative(!): 186.4 Patient's i-STAT hCG is slightly elevated.  I have ordered a quantitative hCG. ? Pelvic process Differential considerations include pelvic inflammatory disease, ectopic pregnancy, appendicitis, urinary calculi, primary dysmenorrhea, septic abortion, ruptured ovarian cyst or tumor, ovarian torsion, tubo-ovarian abscess, degeneration of fibroid, endometriosis, diverticulitis, cystitis.  [AH]  2027  WBC(!): 18.4 [AH]  2310 Wet prep, genital [AH]  2310 Resp Panel by RT-PCR (Flu A&B, Covid) Anterior Nasal Swab    [AH]    Clinical Course User Index [AH] Margarita Mail, PA-C   Medical Decision Making Amount and/or Complexity of Data Reviewed Labs: ordered. Decision-making details documented in ED Course. Radiology: ordered. Decision-making details documented in ED Course. ECG/medicine tests: ordered.  Risk OTC drugs. Prescription drug management. Decision regarding hospitalization.     Lab Tests:  I Ordered, and personally interpreted labs.  The pertinent results include: CBC shows leukocytosis of 18.4, CMP shows sodium 134, potassium 3.2, CO2 of 20, glucose 122, creatinine 1.2, T. bili 1.9, APTT 43, UA shows leukocytes red blood cells white blood cells few bacteria, H CG 168,, wet prep negative, respiratory panel negative   Imaging Studies ordered:  I ordered imaging studies including chest x-ray, limited right upper quadrant ultrasound, transvaginal ultrasound I independently visualized and interpreted imaging which showed chest X ray is negative, Limited ultrasound right upper quadrant was negative, transvaginal ultrasound reveals no intrauterine pregnancy, mild to moderate nonspecific free fluid. I agree with the radiologist interpretation   Cardiac Monitoring:  The patient was maintained on a cardiac monitor.  I personally viewed and interpreted the cardiac monitored which showed an underlying rhythm of: N/A   Medicines ordered and prescription drug management:  I ordered medication including fentanyl, Zofran, lactated Ringer's I have reviewed the patients home medicines and have made adjustments as needed  Critical Interventions:  N/A   Reevaluation:  Patient is reassessed resting comfortably, still having some slight strep quadrant tenderness, without guarding rebound tenderness, updated on imaging, I recommend admission at this time she is agreement this  plan.  MRI shows right-sided pyelonephritis which is likely the cause of patient's right upper quadrant tenderness.  Will update hospitalist.   Consultations Obtained:  I requested consultation with the hospitalist Dr. Marlowe Sax,  and discussed lab and imaging findings as well as pertinent plan - they recommend: She would like MR of abdomen prior to admission to rule out appendicitis, if unremarkable can be admitted to the hospitalist team. Sent a secure chat to Dr. Fara Chute she will admit the patient.    Test Considered:  N/A    Rule out I have low suspicion for PID no endorsing vaginal discharge vaginal bleeding no pelvic tenderness on previous providers pelvic exam.  I have low suspicion for hepatic or biliary abnormality as she has no elevation liver enzymes alk phos, only minimal elevation in T. bili, limited ultrasound was negative for acute findings.  I have low suspicion for bowel obstruction still passing gas having bowel movements.  I have low suspicion for pancreatitis as lipase is 23.  Low suspicion for pancreatitis, bowel obstruction, volvulus, imaging is all negative these findings    Dispostion and problem list  After consideration of the diagnostic results and the patients response to treatment, I feel that the patent would benefit from admission.  Sepsis-secondary due to pyelonephritis, started on antibiotics will need further evaluation. Positive hCG-patient will need repeat hCGs and  possible repeat transvaginal sound for rule out of ectopic pregnancy.           Marcello Fennel, PA-C 10/16/21 0545    Godfrey Pick, MD 10/17/21 [phone]

## 2021-10-16 NOTE — ED Notes (Signed)
MD Tamala Julian notified about pt temp of 101.7.  No new orders at this time.

## 2021-10-16 NOTE — ED Notes (Signed)
Per lab tech, she will add on the lipase at this time. Provider in room at this time discussing plan of care with patinet

## 2021-10-16 NOTE — ED Notes (Signed)
Transported to MRI

## 2021-10-17 DIAGNOSIS — N12 Tubulo-interstitial nephritis, not specified as acute or chronic: Secondary | ICD-10-CM

## 2021-10-17 DIAGNOSIS — N10 Acute tubulo-interstitial nephritis: Secondary | ICD-10-CM | POA: Diagnosis not present

## 2021-10-17 DIAGNOSIS — E349 Endocrine disorder, unspecified: Secondary | ICD-10-CM | POA: Diagnosis not present

## 2021-10-17 DIAGNOSIS — E876 Hypokalemia: Secondary | ICD-10-CM | POA: Diagnosis not present

## 2021-10-17 LAB — CBC
HCT: 31 % — ABNORMAL LOW (ref 36.0–46.0)
Hemoglobin: 10.4 g/dL — ABNORMAL LOW (ref 12.0–15.0)
MCH: 34.2 pg — ABNORMAL HIGH (ref 26.0–34.0)
MCHC: 33.5 g/dL (ref 30.0–36.0)
MCV: 102 fL — ABNORMAL HIGH (ref 80.0–100.0)
Platelets: 341 10*3/uL (ref 150–400)
RBC: 3.04 MIL/uL — ABNORMAL LOW (ref 3.87–5.11)
RDW: 12.7 % (ref 11.5–15.5)
WBC: 14.9 10*3/uL — ABNORMAL HIGH (ref 4.0–10.5)
nRBC: 0 % (ref 0.0–0.2)

## 2021-10-17 LAB — HEPATITIS PANEL, ACUTE
HCV Ab: NONREACTIVE
Hep A IgM: NONREACTIVE
Hep B C IgM: NONREACTIVE
Hepatitis B Surface Ag: NONREACTIVE

## 2021-10-17 LAB — BASIC METABOLIC PANEL
Anion gap: 9 (ref 5–15)
BUN: 8 mg/dL (ref 6–20)
CO2: 22 mmol/L (ref 22–32)
Calcium: 8.6 mg/dL — ABNORMAL LOW (ref 8.9–10.3)
Chloride: 105 mmol/L (ref 98–111)
Creatinine, Ser: 0.81 mg/dL (ref 0.44–1.00)
GFR, Estimated: >60 mL/min (ref >60)
Glucose, Bld: 101 mg/dL — ABNORMAL HIGH (ref 70–99)
Potassium: 3.4 mmol/L — ABNORMAL LOW (ref 3.5–5.1)
Sodium: 136 mmol/L (ref 135–145)

## 2021-10-17 LAB — HEPATIC FUNCTION PANEL
ALT: 56 U/L — ABNORMAL HIGH (ref 0–44)
AST: 77 U/L — ABNORMAL HIGH (ref 15–41)
Albumin: 2.3 g/dL — ABNORMAL LOW (ref 3.5–5.0)
Alkaline Phosphatase: 165 U/L — ABNORMAL HIGH (ref 38–126)
Bilirubin, Direct: 0.9 mg/dL — ABNORMAL HIGH (ref 0.0–0.2)
Indirect Bilirubin: 0.9 mg/dL (ref 0.3–0.9)
Total Bilirubin: 1.8 mg/dL — ABNORMAL HIGH (ref 0.3–1.2)
Total Protein: 6.6 g/dL (ref 6.5–8.1)

## 2021-10-17 LAB — GAMMA GT: GGT: 105 U/L — ABNORMAL HIGH (ref 7–50)

## 2021-10-17 LAB — URINE CULTURE: Culture: <10000 — AB

## 2021-10-17 LAB — GC/CHLAMYDIA PROBE AMP (~~LOC~~) NOT AT ARMC
Chlamydia: NEGATIVE
Comment: NEGATIVE
Comment: NORMAL
Neisseria Gonorrhea: NEGATIVE

## 2021-10-17 MED ORDER — POTASSIUM CHLORIDE CRYS ER 20 MEQ PO TBCR
40.0000 meq | EXTENDED_RELEASE_TABLET | Freq: Once | ORAL | Status: AC
  Administered 2021-10-17: 40 meq via ORAL
  Filled 2021-10-17: qty 2

## 2021-10-17 NOTE — Progress Notes (Signed)
[facility]                                                                              [place], is a 26 y.o. female, DOB - 1995/03/03, MEQ:[id number] Admit date - 10/15/2021    Outpatient Primary MD for the patient is Patient, [name]  LOS - 1  days  Chief Complaint  Patient presents with   Fever   Abdominal Pain       Brief summary   Patient is a 26 year old female with asthma, anemia, prediabetes, ADHD presented with abdominal pain.  Symptoms started 9 days ago with LQ pain.  She developed chills, subjective fevers, generalized body aches, nausea and constipation, then had loose stools over the last 2 days.  Last menstrual cycle August 21. In ED, patient was noted to be febrile 102.6 F with tachycardia, tachypnea, WBCs 18.4, potassium 3.2, creatinine 1.26 i-STAT hCG 186.4, quantitative hCG 168, lactic acidosis 2.6 UA positive for UTI.  Wet prep negative.  COVID-19 negative.  Extensive imaging including RUQ ultrasound negative, transvaginal ultrasound and MRI abdomen pelvis noted pyelonephritis of right kidney.  Patient was admitted for further work-up   Assessment & Plan    Principal Problem: Sepsis secondary to UTI and acute right-sided pyelonephritis -Patient met sepsis criteria due to fevers, tachycardia, tachypnea, leukocytosis, lactic acidosis, source likely due to UTI and pyelonephritis -Follow blood cultures, urine cultures, placed on IV Rocephin  - continue IV fluid hydration -WBCs trending down, 14.9  Active Problems:    Hypokalemia -Replaced  Mild acute kidney injury likely due to #1 -Continue IV fluid hydration, creatinine 1.26 on admission, improved to 0.8  Transaminitis with hyperbilirubinemia, elevated alk phos -Right upper quadrant ultrasound showed no gallstones, unremarkable Hello - will check hepatitis panel, GGT, trend LFTs -Total bili improving, 2.3 on 9/20-> 1.8 today -GI consult if LFTs continues to worsen -MRI abdomen  pelvis showed no gallstones, gallbladder wall thickening or pericholecystic fluid, no intrahepatic or extrahepatic biliary dilation    Elevated serum hCG -  initial i-STAT hCG 186.4 and follow-up quantitative hCG 168.   -Transvaginal ultrasound noted a 1.8 cm probable corpus luteum of the right ovary also noted on MRI, but no gestational sac was seen.  Question possibility of early pregnancy/pregnancy of unknown location or failed pregnancy. -Dr. Tamala Julian discussed with Dr. Juluis Rainier on-call OB/GYN, recommended recheck of beta-hCG in 48 hours.  Outpatient follow-up with [facility] after discharge.      Tobacco abuse -Continue nicotine patch    Code Status: full code  DVT Prophylaxis:  enoxaparin (LOVENOX) injection 40 mg Start: 10/16/21 1000   Level of Care: Level of care: Med-Surg Family Communication: Updated patient, fully alert and oriented    Disposition Plan:      Remains inpatient appropriate:  on IV antibiotics, pending cultures    Procedures:  None   Consultants:   OB on call   Antimicrobials:   Anti-infectives (From admission, onward)    Start     Dose/Rate Route Frequency Ordered Stop   10/15/21 2200  ceFEPIme (MAXIPIME) 2 g in sodium chloride 0.9 % 100 mL IVPB  2 g 200 mL/hr over 30 Minutes Intravenous Every 8 hours 10/15/21 2121     10/15/21 2130  cefTRIAXone (ROCEPHIN) 1 g in sodium chloride 0.9 % 100 mL IVPB  Status:  Discontinued        1 g 200 mL/hr over 30 Minutes Intravenous  Once 10/15/21 2116 10/15/21 2121   10/15/21 2130  metroNIDAZOLE (FLAGYL) IVPB 500 mg  Status:  Discontinued        500 mg 100 mL/hr over 60 Minutes Intravenous Every 12 hours 10/15/21 2116 10/15/21 2121          Medications  enoxaparin (LOVENOX) injection  40 mg Subcutaneous Q24H   nicotine  14 mg Transdermal Daily   sodium chloride flush  3 mL Intravenous Q12H      Subjective:   Kim Fritz was seen and examined today.  Still having some pain in the right flank area,  no nausea or vomiting.  Overnight had low-grade fevers.    Objective:   Vitals:   10/16/21 2050 10/17/21 0018 10/17/21 0539 10/17/21 1010  BP: 115/73 116/79 124/79 114/80  Pulse: 90 83 68 72  Resp: _0 Temp: (!) 100.5 F (38.1 C) 99.5 F (37.5 C) 99 F (37.2 C) 98.3 F (36.8 C)  TempSrc: Oral Oral Oral Oral  SpO2: 100% 100% 100% 100%  Weight:      Height:        Intake/Output Summary (Last 24 hours) at 10/17/2021 1010 Last data filed at 10/16/2021 1900 Gross per 24 hour  Intake 653.15 ml  Output --  Net 653.15 ml     Wt Readings from Last 3 Encounters:  10/15/21 76.7 kg  02/24/19 85.3 kg  12/30/18 86.7 kg     Exam General: Alert and oriented x 3, NAD Cardiovascular: S1 S2 auscultated,  RRR Respiratory: Clear to auscultation bilaterally, no wheezing, rales or rhonchi Gastrointestinal: Soft, nontender, nondistended, NBS, right-sided CVAT Ext: no pedal edema bilaterally Neuro: Strength 5/5 upper and lower extremities bilaterally Psych: Normal affect and demeanor, alert and oriented x3     Data Reviewed:  I have personally reviewed following labs    CBC Lab Results  Component Value Date   WBC 14.9 (H) 10/17/2021   RBC 3.04 (L) 10/17/2021   HGB 10.4 (L) 10/17/2021   HCT 31.0 (L) 10/17/2021   MCV 102.0 (H) 10/17/2021   MCH 34.2 (H) 10/17/2021   PLT 341 10/17/2021   MCHC 33.5 10/17/2021   RDW 12.7 10/17/2021   LYMPHSABS 0.9 10/15/2021   MONOABS 2.6 (H) 10/15/2021   EOSABS 0.0 10/15/2021   BASOSABS 0.0 11/65/7903     Last metabolic panel Lab Results  Component Value Date   NA 136 10/17/2021   K 3.4 (L) 10/17/2021   CL 105 10/17/2021   CO2 22 10/17/2021   BUN 8 10/17/2021   CREATININE 0.81 10/17/2021   GLUCOSE 101 (H) 10/17/2021   GFRNONAA >60 10/17/2021   GFRAA NOT CALCULATED 12/19/2011   CALCIUM 8.6 (L) 10/17/2021   PROT 6.6 10/17/2021   ALBUMIN 2.3 (L) 10/17/2021   BILITOT 1.8 (H) 10/17/2021   ALKPHOS 165 (H) 10/17/2021   AST 77  (H) 10/17/2021   ALT 56 (H) 10/17/2021   ANIONGAP 9 10/17/2021    CBG (last 3)  No results for input(s): "GLUCAP" in the last 72 hours.    Coagulation Profile: Recent Labs  Lab 10/15/21 1914  INR 1.0     Radiology Studies: I have personally reviewed the imaging  studies  MR ABDOMEN WO CONTRAST  Result Date: 10/16/2021 CLINICAL DATA:  26 y.o. female was evaluated in triage. Pt complains of abdominal pain. Started last week on Monday. RLQ pain and radiates to back. Fever at home. Some diarrhea and constipation at times. Nausea but no vomiting. Poor appetite EIMPRESSION: 1. Appendix is identified medial to the cecum and extending down towards the right lower quadrant without appendiceal dilatation or periappendiceal edema/inflammation. 2. Subtle heterogeneous signal intensity in the right kidney with perirenal edema. The heterogeneous signal intensity shows a subtle striated appearance in some regions on T2 imaging. Overall imaging features would be compatible with pyelonephritis. 3. Small to moderate volume free fluid in the pelvis. 4. 3.0 cm structure in the right adnexal space is compatible with the ovary and contains a 1.7 cm subtle focus of increased T1 signal, potentially hemorrhagic corpus luteum cyst as characterized on ultrasound yesterday. This is incompletely evaluated by MRI given lack of intravenous contrast. Electronically Signed   By: Misty Stanley M.D.   On: 10/16/2021 05:22   MR PELVIS WO CONTRAST  Result Date: 10/16/2021 IMPRESSION: 1. Appendix is identified medial to the cecum and extending down towards the right lower quadrant without appendiceal dilatation or periappendiceal edema/inflammation. 2. Subtle heterogeneous signal intensity in the right kidney with perirenal edema. The heterogeneous signal intensity shows a subtle striated appearance in some regions on T2 imaging. Overall imaging features would be compatible with pyelonephritis. 3. Small to moderate volume free  fluid in the pelvis. 4. 3.0 cm structure in the right adnexal space is compatible with the ovary and contains a 1.7 cm subtle focus of increased T1 signal, potentially hemorrhagic corpus luteum cyst as characterized on ultrasound yesterday. This is incompletely evaluated by MRI given lack of intravenous contrast. Electronically Signed   By: Misty Stanley M.D.   On: 10/16/2021 05:22   US OB Comp < 14 Wks  Result Date: 10/16/2021 CLINICAL DATA:  [id number] WITH FIRST TRIMESTER PELVIC PAIN. BETA HCG IS ONLY 186.4. EXAM: OBSTETRIC <14 WK [place] AND TRANSVAGINAL OB US TECHNIQUE:  IMPRESSION: 1. No intrauterine pregnancy is seen. Differential diagnosis includes early dates, failed pregnancy and ectopic pregnancy. Follow-up as indicated. 2. Mild to moderate nonspecific free fluid. 3. 1.8 cm probable corpus luteum right ovary. Electronically Signed   By: Telford Nab M.D.   On: 10/16/2021 00:57   US OB Transvaginal  Result Date: 10/16/2021 IMPRESSION: 1. No intrauterine pregnancy is seen. Differential diagnosis includes early dates, failed pregnancy and ectopic pregnancy. Follow-up as indicated. 2. Mild to moderate nonspecific free fluid. 3. 1.8 cm probable corpus luteum right ovary. Electronically Signed   By: Telford Nab M.D.   On: 10/16/2021 00:57   US Abdomen Limited RUQ (LIVER/GB)  Result Date: 10/15/2021 CLINICAL DATA:  Right upper quadrant tenderness  IMPRESSION: 1. Unremarkable right upper quadrant ultrasound. Electronically Signed   By: Randa Ngo M.D.   On: 10/15/2021 20:05   DG Chest 2 View  Result Date: 10/15/2021 CLINICAL DATA:  Suspected Sepsis. IMPRESSION: No active cardiopulmonary disease. Electronically Signed   By: Iven Finn M.D.   On: 10/15/2021 19:28       Ashlynne Shetterly M.D. [facility] 10/17/2021, 10:10 AM  Available via Epic secure chat 7am-7pm After 7 pm, please refer to night coverage provider listed on amion.

## 2021-10-17 NOTE — Plan of Care (Signed)

## 2021-10-18 ENCOUNTER — Ambulatory Visit: Payer: Medicaid Other

## 2021-10-18 ENCOUNTER — Other Ambulatory Visit: Payer: Medicaid Other

## 2021-10-18 DIAGNOSIS — E876 Hypokalemia: Secondary | ICD-10-CM | POA: Diagnosis not present

## 2021-10-18 DIAGNOSIS — N10 Acute tubulo-interstitial nephritis: Secondary | ICD-10-CM | POA: Diagnosis not present

## 2021-10-18 DIAGNOSIS — E349 Endocrine disorder, unspecified: Secondary | ICD-10-CM | POA: Diagnosis not present

## 2021-10-18 LAB — COMPREHENSIVE METABOLIC PANEL
ALT: 63 U/L — ABNORMAL HIGH (ref 0–44)
AST: 68 U/L — ABNORMAL HIGH (ref 15–41)
Albumin: 2.2 g/dL — ABNORMAL LOW (ref 3.5–5.0)
Alkaline Phosphatase: 145 U/L — ABNORMAL HIGH (ref 38–126)
Anion gap: 8 (ref 5–15)
BUN: 7 mg/dL (ref 6–20)
CO2: 22 mmol/L (ref 22–32)
Calcium: 8.3 mg/dL — ABNORMAL LOW (ref 8.9–10.3)
Chloride: 108 mmol/L (ref 98–111)
Creatinine, Ser: 0.66 mg/dL (ref 0.44–1.00)
GFR, Estimated: >60 mL/min (ref >60)
Glucose, Bld: 115 mg/dL — ABNORMAL HIGH (ref 70–99)
Potassium: 3.8 mmol/L (ref 3.5–5.1)
Sodium: 138 mmol/L (ref 135–145)
Total Bilirubin: 0.7 mg/dL (ref 0.3–1.2)
Total Protein: 6 g/dL — ABNORMAL LOW (ref 6.5–8.1)

## 2021-10-18 LAB — CBC
HCT: 26.7 % — ABNORMAL LOW (ref 36.0–46.0)
Hemoglobin: 9 g/dL — ABNORMAL LOW (ref 12.0–15.0)
MCH: 34.4 pg — ABNORMAL HIGH (ref 26.0–34.0)
MCHC: 33.7 g/dL (ref 30.0–36.0)
MCV: 101.9 fL — ABNORMAL HIGH (ref 80.0–100.0)
Platelets: 346 10*3/uL (ref 150–400)
RBC: 2.62 MIL/uL — ABNORMAL LOW (ref 3.87–5.11)
RDW: 12.8 % (ref 11.5–15.5)
WBC: 10.6 10*3/uL — ABNORMAL HIGH (ref 4.0–10.5)
nRBC: 0 % (ref 0.0–0.2)

## 2021-10-18 LAB — HCG, QUANTITATIVE, PREGNANCY: hCG, Beta Chain, Quant, S: 348 m[IU]/mL — ABNORMAL HIGH (ref <5)

## 2021-10-18 MED ORDER — CEPHALEXIN 500 MG PO CAPS: 500.0000 mg | ORAL_CAPSULE | Freq: Four times a day (QID) | ORAL | 0 refills | Status: AC

## 2021-10-18 MED ORDER — ONDANSETRON HCL 4 MG PO TABS: 4.0000 mg | ORAL_TABLET | Freq: Three times a day (TID) | ORAL | 0 refills | Status: DC | PRN

## 2021-10-18 MED ORDER — FOLIC ACID 1 MG PO TABS: 1.0000 mg | ORAL_TABLET | Freq: Every day | ORAL | 6 refills | Status: AC

## 2021-10-18 MED ORDER — PRENATAL MULTIVITAMIN CH: 1.0000 | ORAL_TABLET | Freq: Every day | ORAL | 6 refills | Status: DC

## 2021-10-18 NOTE — Discharge Summary (Signed)
Physician Discharge Summary   Kim Fritz: Kim Fritz MRN: [id number] DOB: [birth year]  Admit date:     10/15/2021  Discharge date: 10/18/21  Discharge Physician: Estill Cotta, MD    PCP: Kim Fritz, [name]   Recommendations at discharge:   Placed on cephalexin 500 mg every 6 hours for 11 days to complete full course Placed on folic acid 1 mg daily, prenatal multivitamin daily Kim Fritz will follow-up with Dr. Si Raider, [facility]   Discharge Diagnoses:    Acute pyelonephritis   Early pregnancy   Sepsis ([place])   Hypokalemia Acute kidney injury   Elevated serum hCG   Hyperbilirubinemia, transaminitis   Tobacco abuse   Hospital Course: Kim Fritz is a 26 year old female with asthma, anemia, prediabetes, ADHD presented with abdominal pain.  Symptoms started 9 days ago with LQ pain.  She developed chills, subjective fevers, generalized body aches, nausea and constipation, then had loose stools over the last 2 days.  Last menstrual cycle August 21. In ED, Kim Fritz was noted to be febrile 102.6 F with tachycardia, tachypnea, WBCs 18.4, potassium 3.2, creatinine 1.26 i-STAT hCG 186.4, quantitative hCG 168, lactic acidosis 2.6 UA positive for UTI.  Wet prep negative.  COVID-19 negative.  Extensive imaging including RUQ ultrasound negative, transvaginal ultrasound and MRI abdomen pelvis noted pyelonephritis of right kidney.  Kim Fritz was admitted for further work-up    Assessment and Plan:  Sepsis secondary to UTI and acute right-sided pyelonephritis -Kim Fritz met sepsis criteria due to fevers, tachycardia, tachypnea, leukocytosis, lactic acidosis, source likely due to UTI and pyelonephritis -Blood cultures negative to date, urine cultures showed less than 10,000 colonies of insignificant growth.  Wet prep negative -Kim Fritz was placed on IV cefepime while inpatient, discussed with ID, Dr. Graylon Good, recommended oral cephalexin 500 mg every 6 hours for 11 days to complete full course     Hypokalemia -Replaced    Mild acute kidney injury likely due to #1 -Kim Fritz was placed on IV fluid hydration.   -creatinine 1.26 on admission, improved to 0.6 at discharge, tolerating diet.   Transaminitis with hyperbilirubinemia, elevated alk phos -Right upper quadrant ultrasound showed no gallstones, unremarkable Hello - will check hepatitis panel, GGT, trend LFTs -Total bili improving, 2.3 on 9/20-> 1.8  -> 0.7 -GI consult if LFTs continues to worsen -MRI abdomen pelvis showed no gallstones, gallbladder wall thickening or pericholecystic fluid, no intrahepatic or extrahepatic biliary dilation     Elevated serum hCG/early pregnancy -  initial i-STAT hCG 186.4 and follow-up quantitative hCG 168. ->  348 -Transvaginal ultrasound noted a 1.8 cm probable corpus luteum of the right ovary also noted on MRI, but no gestational sac was seen.  Question possibility of early pregnancy/pregnancy of unknown location or failed pregnancy. -Dr. Tamala Julian discussed with Dr. Si Raider on-call OB/GYN, recommended recheck of beta-hCG in 48 hours.  Outpatient follow-up with [facility] after discharge.       Tobacco abuse -Kim Fritz was placed on nicotine patch       Pain control - [place] Controlled Substance Reporting System database was reviewed. and Kim Fritz was instructed, not to drive, operate heavy machinery, perform activities at heights, swimming or participation in water activities or provide baby-sitting services while on Pain, Sleep and Anxiety Medications; until their outpatient Physician has advised to do so again. Also recommended to not to take more than prescribed Pain, Sleep and Anxiety Medications.  Consultants: OB/GYN, ID Procedures performed: None Disposition: Home Diet recommendation:  Discharge Diet Orders (From admission, onward)     Start  Ordered   10/18/21 0000  Diet general        10/18/21 1228           Regular diet DISCHARGE MEDICATION: Allergies as of 10/18/2021       Reactions    Penicillins Hives, Itching   Did it involve swelling of the face/tongue/throat, SOB, or low BP? No Did it involve sudden or severe rash/hives, skin peeling, or any reaction on the inside of your mouth or nose? No Did you need to seek medical attention at a hospital or doctor's office? Yes When did it last happen? 2013      If all above answers are "NO", may proceed with cephalosporin use.        Medication List     TAKE these medications    acetaminophen 325 MG tablet Commonly known as: Tylenol Take 2 tablets (650 mg total) by mouth every 6 (six) hours as needed (for pain scale < 4).   cephALEXin 500 MG capsule Commonly known as: KEFLEX Take 1 capsule (500 mg total) by mouth 4 (four) times daily for 11 days.   folic acid 1 MG tablet Commonly known as: FOLVITE Take 1 tablet (1 mg total) by mouth daily.   ondansetron 4 MG tablet Commonly known as: ZOFRAN Take 1 tablet (4 mg total) by mouth every 8 (eight) hours as needed for nausea or vomiting.   prenatal multivitamin Tabs tablet Take 1 tablet by mouth daily. Also available over-the-counter        Follow-up Information     Wouk, Ailene Rud, MD. Schedule an appointment as soon as possible for a visit in 1 week(s).   Specialties: Obstetrics and Gynecology, Family Medicine Why: Please make appointment for follow-up/pregnancy Contact information: [place] [facility] at [facility]. Schedule an appointment as soon as possible for a visit in 1 week(s).   Specialty: Maternal and Fetal Medicine Why: for hospital follow-up Contact information: [address] [place] [address] [phone]               Discharge Exam: Danley Danker Weights   10/15/21 1849  Weight: 76.7 kg   S: No acute complaints, no fevers or chills, no dysuria.  Right flank pain is improving  Vitals:   10/17/21 1655 10/17/21 2112  10/18/21 0625 10/18/21 0931  BP: 115/78 125/84 119/86 116/79  Pulse: 77 67 64 68  Resp: 18   16  Temp: 98.1 F (36.7 C) 98.6 F (37 C) 98.5 F (36.9 C) 98.7 F (37.1 C)  TempSrc: Oral Oral Oral Oral  SpO2: 100% 100% 100% 100%  Weight:      Height:         Physical Exam General: Alert and oriented x 3, NAD Cardiovascular: S1 S2 clear, RRR.  Respiratory: CTAB, no wheezing, rales or rhonchi Gastrointestinal: Soft, nontender, nondistended, NBS Ext: no pedal edema bilaterally Neuro: no new deficits Skin: No rashes Psych: Normal affect and demeanor, alert and oriented x3    Condition at discharge: good  The results of significant diagnostics from this hospitalization (including imaging, microbiology, ancillary and laboratory) are listed below for reference.   Imaging Studies: MR ABDOMEN WO CONTRAST  Result Date: 10/16/2021 CLINICAL DATA:  26 y.o. female was evaluated in triage. Pt complains of abdominal pain. Started last week on Monday. RLQ pain and radiates to back. Fever at home. Some diarrhea and constipation  at times. Nausea but no vomiting. Poor appetite EXAM: MRI ABDOMEN AND PELVIS WITHOUT CONTRAST TECHNIQUE: Multiplanar multisequence MR imaging of the abdomen and pelvis was performed. No intravenous contrast was administered. COMPARISON:  None Available. FINDINGS: COMBINED FINDINGS FOR BOTH MR ABDOMEN AND PELVIS Lower chest: Unremarkable. Hepatobiliary: No suspicious focal abnormality in the liver on this study without intravenous contrast. There is no evidence for gallstones, gallbladder wall thickening, or pericholecystic fluid. No intrahepatic or extrahepatic biliary dilation. Pancreas: No focal mass lesion. No dilatation of the main duct. No intraparenchymal cyst. No peripancreatic edema. Spleen:  No splenomegaly. No focal mass lesion. Adrenals/Urinary Tract: No adrenal nodule or mass. Right kidney shows subtle asymmetric enlargement on axial imaging (see axial haste image 26  of series 4). There is some edema around the lower pole of the right kidney with right kidney showing subtle heterogeneous signal intensity having and almost striated appearance towards the upper pole (images 18-20 of series 9). Subcapsular heterogeneous signal seen in the posterior interpolar region on 24/9 and in the lower pole region on images 30 and 31 of series. Left kidney appears more homogeneous in signal intensity. There is no hydronephrosis on either side. No discernible hydroureter. The urinary bladder appears normal for the degree of distention. Stomach/Bowel: Stomach is unremarkable. No gastric wall thickening. No evidence of outlet obstruction. Duodenum is normally positioned as is the ligament of Treitz. No small bowel dilatation. Terminal ileum unremarkable. Appendix is identified medial to the cecum and extending down towards the right lower quadrant without appendiceal dilatation or periappendiceal edema/inflammation. Colon is nondilated. Vascular/Lymphatic: No abdominal aortic aneurysm. No abdominal lymphadenopathy No pelvic sidewall lymphadenopathy. Reproductive: Uterus unremarkable. 3.0 x 2.6 x 2.8 cm structure in the right adnexal space is probably the ovary. Fat suppressed T1 imaging shows a small focus of subtle increased T1 shortening measuring about 1.7 cm in size, likely corresponding to the 1.8 cm isoechoic solid avascular lesion noted on ultrasound yesterday. This is incompletely evaluated by MRI given lack of intravenous contrast but was characterized as corpus luteum cyst ultrasound. Other:  Small to moderate volume free fluid noted in the pelvis. Musculoskeletal: No suspicious marrow signal abnormality within the visualized bony anatomy. IMPRESSION: 1. Appendix is identified medial to the cecum and extending down towards the right lower quadrant without appendiceal dilatation or periappendiceal edema/inflammation. 2. Subtle heterogeneous signal intensity in the right kidney with  perirenal edema. The heterogeneous signal intensity shows a subtle striated appearance in some regions on T2 imaging. Overall imaging features would be compatible with pyelonephritis. 3. Small to moderate volume free fluid in the pelvis. 4. 3.0 cm structure in the right adnexal space is compatible with the ovary and contains a 1.7 cm subtle focus of increased T1 signal, potentially hemorrhagic corpus luteum cyst as characterized on ultrasound yesterday. This is incompletely evaluated by MRI given lack of intravenous contrast. Electronically Signed   By: Misty Stanley M.D.   On: 10/16/2021 05:22   MR PELVIS WO CONTRAST  Result Date: 10/16/2021 CLINICAL DATA:  26 y.o. female was evaluated in triage. Pt complains of abdominal pain. Started last week on Monday. RLQ pain and radiates to back. Fever at home. Some diarrhea and constipation at times. Nausea but no vomiting. Poor appetite EXAM: MRI ABDOMEN AND PELVIS WITHOUT CONTRAST TECHNIQUE: Multiplanar multisequence MR imaging of the abdomen and pelvis was performed. No intravenous contrast was administered. COMPARISON:  None Available. FINDINGS: COMBINED FINDINGS FOR BOTH MR ABDOMEN AND PELVIS Lower chest: Unremarkable. Hepatobiliary: No suspicious focal  abnormality in the liver on this study without intravenous contrast. There is no evidence for gallstones, gallbladder wall thickening, or pericholecystic fluid. No intrahepatic or extrahepatic biliary dilation. Pancreas: No focal mass lesion. No dilatation of the main duct. No intraparenchymal cyst. No peripancreatic edema. Spleen:  No splenomegaly. No focal mass lesion. Adrenals/Urinary Tract: No adrenal nodule or mass. Right kidney shows subtle asymmetric enlargement on axial imaging (see axial haste image 26 of series 4). There is some edema around the lower pole of the right kidney with right kidney showing subtle heterogeneous signal intensity having and almost striated appearance towards the upper pole (images  18-20 of series 9). Subcapsular heterogeneous signal seen in the posterior interpolar region on 24/9 and in the lower pole region on images 30 and 31 of series. Left kidney appears more homogeneous in signal intensity. There is no hydronephrosis on either side. No discernible hydroureter. The urinary bladder appears normal for the degree of distention. Stomach/Bowel: Stomach is unremarkable. No gastric wall thickening. No evidence of outlet obstruction. Duodenum is normally positioned as is the ligament of Treitz. No small bowel dilatation. Terminal ileum unremarkable. Appendix is identified medial to the cecum and extending down towards the right lower quadrant without appendiceal dilatation or periappendiceal edema/inflammation. Colon is nondilated. Vascular/Lymphatic: No abdominal aortic aneurysm. No abdominal lymphadenopathy No pelvic sidewall lymphadenopathy. Reproductive: Uterus unremarkable. 3.0 x 2.6 x 2.8 cm structure in the right adnexal space is probably the ovary. Fat suppressed T1 imaging shows a small focus of subtle increased T1 shortening measuring about 1.7 cm in size, likely corresponding to the 1.8 cm isoechoic solid avascular lesion noted on ultrasound yesterday. This is incompletely evaluated by MRI given lack of intravenous contrast but was characterized as corpus luteum cyst ultrasound. Other:  Small to moderate volume free fluid noted in the pelvis. Musculoskeletal: No suspicious marrow signal abnormality within the visualized bony anatomy. IMPRESSION: 1. Appendix is identified medial to the cecum and extending down towards the right lower quadrant without appendiceal dilatation or periappendiceal edema/inflammation. 2. Subtle heterogeneous signal intensity in the right kidney with perirenal edema. The heterogeneous signal intensity shows a subtle striated appearance in some regions on T2 imaging. Overall imaging features would be compatible with pyelonephritis. 3. Small to moderate volume  free fluid in the pelvis. 4. 3.0 cm structure in the right adnexal space is compatible with the ovary and contains a 1.7 cm subtle focus of increased T1 signal, potentially hemorrhagic corpus luteum cyst as characterized on ultrasound yesterday. This is incompletely evaluated by MRI given lack of intravenous contrast. Electronically Signed   By: Misty Stanley M.D.   On: 10/16/2021 05:22   US OB Comp < 14 Wks  Result Date: 10/16/2021 CLINICAL DATA:  [id number] WITH FIRST TRIMESTER PELVIC PAIN. BETA HCG IS ONLY 186.4. EXAM: OBSTETRIC <14 WK [place] AND TRANSVAGINAL OB US TECHNIQUE: Both transabdominal and transvaginal ultrasound examinations were performed for complete evaluation of the gestation as well as the maternal uterus, adnexal regions, and pelvic cul-de-sac. Transvaginal technique was performed to assess early pregnancy. COMPARISON:  None Available. FINDINGS: Intrauterine gestational sac: None Yolk sac:  Not Visualized. Embryo:  Not Visualized. Cardiac Activity: N/a. MSD: None. CRL: None. Subchorionic hemorrhage:  None visualized. Maternal uterus/adnexae: The uterus is anteverted measuring 8 x 3.9 x 5.5 cm. No wall mass is seen. The cervix is unremarkable and closed. The endometrium measures 8.2 mm in thickness with no abnormal color flow. The ovaries are normal in size. Both show color flow. The right  ovary contains a 1.8 cm isoechoic solid avascular lesion with a small sonolucent center consistent with a corpus luteum. The left ovary is unremarkable. There is mild to moderate free fluid in the cul-de-sac tracking to the adnexal spaces but no complex fluid or paraovarian mass is seen. IMPRESSION: 1. No intrauterine pregnancy is seen. Differential diagnosis includes early dates, failed pregnancy and ectopic pregnancy. Follow-up as indicated. 2. Mild to moderate nonspecific free fluid. 3. 1.8 cm probable corpus luteum right ovary. Electronically Signed   By: Telford Nab M.D.   On: 10/16/2021 00:57   US OB  Transvaginal  Result Date: 10/16/2021 CLINICAL DATA:  [id number] WITH FIRST TRIMESTER PELVIC PAIN. BETA HCG IS ONLY 186.4. EXAM: OBSTETRIC <14 WK [place] AND TRANSVAGINAL OB US TECHNIQUE: Both transabdominal and transvaginal ultrasound examinations were performed for complete evaluation of the gestation as well as the maternal uterus, adnexal regions, and pelvic cul-de-sac. Transvaginal technique was performed to assess early pregnancy. COMPARISON:  None Available. FINDINGS: Intrauterine gestational sac: None Yolk sac:  Not Visualized. Embryo:  Not Visualized. Cardiac Activity: N/a. MSD: None. CRL: None. Subchorionic hemorrhage:  None visualized. Maternal uterus/adnexae: The uterus is anteverted measuring 8 x 3.9 x 5.5 cm. No wall mass is seen. The cervix is unremarkable and closed. The endometrium measures 8.2 mm in thickness with no abnormal color flow. The ovaries are normal in size. Both show color flow. The right ovary contains a 1.8 cm isoechoic solid avascular lesion with a small sonolucent center consistent with a corpus luteum. The left ovary is unremarkable. There is mild to moderate free fluid in the cul-de-sac tracking to the adnexal spaces but no complex fluid or paraovarian mass is seen. IMPRESSION: 1. No intrauterine pregnancy is seen. Differential diagnosis includes early dates, failed pregnancy and ectopic pregnancy. Follow-up as indicated. 2. Mild to moderate nonspecific free fluid. 3. 1.8 cm probable corpus luteum right ovary. Electronically Signed   By: Telford Nab M.D.   On: 10/16/2021 00:57   US Abdomen Limited RUQ (LIVER/GB)  Result Date: 10/15/2021 CLINICAL DATA:  Right upper quadrant tenderness EXAM: ULTRASOUND ABDOMEN LIMITED RIGHT UPPER QUADRANT COMPARISON:  None Available. FINDINGS: Gallbladder: No gallstones or wall thickening visualized. No sonographic Murphy sign noted by sonographer. Common bile duct: Diameter: 2 mm Liver: No focal lesion identified. Within normal limits in  parenchymal echogenicity. Portal vein is patent on color Doppler imaging with normal direction of blood flow towards the liver. Other: None. IMPRESSION: 1. Unremarkable right upper quadrant ultrasound. Electronically Signed   By: Randa Ngo M.D.   On: 10/15/2021 20:05   DG Chest 2 View  Result Date: 10/15/2021 CLINICAL DATA:  Suspected Sepsis. Triage notes:"Pt reports RLQ pain, chills, and some mild diarrhea for the past couple of days. [facility] for concerns of appendicitis. Denies any urinary symptoms. " EXAM: CHEST - 2 VIEW COMPARISON:  None Available. FINDINGS: The heart and mediastinal contours are within normal limits. No focal consolidation. No pulmonary edema. No pleural effusion. No pneumothorax. No acute osseous abnormality. IMPRESSION: No active cardiopulmonary disease. Electronically Signed   By: Iven Finn M.D.   On: 10/15/2021 19:28    Microbiology: Results for orders placed or performed during the hospital encounter of 10/15/21  Culture, blood (Routine x 2)     Status: None (Preliminary result)   Collection Time: 10/15/21  7:14 PM   Specimen: BLOOD  Result Value Ref Range Status   Specimen Description BLOOD SITE NOT SPECIFIED  Final  Special Requests   Final    BOTTLES DRAWN AEROBIC AND ANAEROBIC Blood Culture adequate volume   Culture   Final    NO GROWTH 3 DAYS Performed at [facility], [place] [address], [place], [place] [address]    Report Status PENDING  Incomplete  Culture, blood (Routine x 2)     Status: None (Preliminary result)   Collection Time: 10/15/21  8:31 PM   Specimen: BLOOD  Result Value Ref Range Status   Specimen Description BLOOD SITE NOT SPECIFIED  Final   Special Requests   Final    BOTTLES DRAWN AEROBIC AND ANAEROBIC Blood Culture adequate volume   Culture   Final    NO GROWTH 3 DAYS Performed at [facility], [address], [place], [place] [address]    Report Status PENDING  Incomplete  Resp Panel by  RT-PCR (Flu A&B, Covid) Anterior Nasal Swab     Status: None   Collection Time: 10/15/21  8:35 PM   Specimen: Anterior Nasal Swab  Result Value Ref Range Status   SARS Coronavirus 2 by RT PCR NEGATIVE NEGATIVE Final    Comment: (NOTE) SARS-CoV-2 target nucleic acids are NOT DETECTED.  The SARS-CoV-2 RNA is generally detectable in upper respiratory specimens during the acute phase of infection. The lowest concentration of SARS-CoV-2 viral copies this assay can detect is 138 copies/mL. A negative result does not preclude SARS-Cov-2 infection and should not be used as the sole basis for treatment or other Kim Fritz management decisions. A negative result may occur with  improper specimen collection/handling, submission of specimen other than nasopharyngeal swab, presence of viral mutation(s) within the areas targeted by this assay, and inadequate number of viral copies(<138 copies/mL). A negative result must be combined with clinical observations, Kim Fritz history, and epidemiological information. The expected result is Negative.  Fact Sheet for Patients:  EntrepreneurPulse.com.au  Fact Sheet for Healthcare Providers:  IncredibleEmployment.be  This test is no t yet approved or cleared by the [place] FDA and  has been authorized for detection and/or diagnosis of SARS-CoV-2 by FDA under an Emergency Use Authorization (EUA). This EUA will remain  in effect (meaning this test can be used) for the duration of the COVID-19 declaration under Section 564(b)(1) of the Act, 21 U.S.C.section 360bbb-3(b)(1), unless the authorization is terminated  or revoked sooner.       Influenza A by PCR NEGATIVE NEGATIVE Final   Influenza B by PCR NEGATIVE NEGATIVE Final    Comment: (NOTE) The Xpert Xpress SARS-CoV-2/FLU/RSV plus assay is intended as an aid in the diagnosis of influenza from Nasopharyngeal swab specimens and should not be used as a sole basis for  treatment. Nasal washings and aspirates are unacceptable for Xpert Xpress SARS-CoV-2/FLU/RSV testing.  Fact Sheet for Patients: EntrepreneurPulse.com.au  Fact Sheet for Healthcare Providers: IncredibleEmployment.be  This test is not yet approved or cleared by the [place] FDA and has been authorized for detection and/or diagnosis of SARS-CoV-2 by FDA under an Emergency Use Authorization (EUA). This EUA will remain in effect (meaning this test can be used) for the duration of the COVID-19 declaration under Section 564(b)(1) of the Act, 21 U.S.C. section 360bbb-3(b)(1), unless the authorization is terminated or revoked.  Performed at [facility], [place] [address], [place], [place] [address]   Blood Culture (routine x 2)     Status: None (Preliminary result)   Collection Time: 10/15/21  8:40 PM   Specimen: BLOOD LEFT HAND  Result Value  Ref Range Status   Specimen Description BLOOD LEFT HAND  Final   Special Requests   Final    BOTTLES DRAWN AEROBIC AND ANAEROBIC Blood Culture adequate volume   Culture   Final    NO GROWTH 2 DAYS Performed at [facility], [address] [place], [place] [address]    Report Status PENDING  Incomplete  Urine Culture     Status: Abnormal   Collection Time: 10/15/21  9:27 PM   Specimen: Urine, Clean Catch  Result Value Ref Range Status   Specimen Description URINE, CLEAN CATCH  Final   Special Requests NONE  Final   Culture (A)  Final    <10,000 COLONIES/mL INSIGNIFICANT GROWTH Performed at [facility], [place] [address], [place], [place] [address]    Report Status 10/17/2021 FINAL  Final  Wet prep, genital     Status: None   Collection Time: 10/15/21  9:45 PM   Specimen: Vaginal  Result Value Ref Range Status   Yeast Wet Prep HPF POC NONE SEEN NONE SEEN Final   Trich, Wet Prep NONE SEEN NONE SEEN Final   Clue Cells Wet Prep HPF POC NONE SEEN NONE SEEN Final   WBC, Wet Prep HPF POC  <10 <10 Final   Sperm NONE SEEN  Final    Comment: Performed at [facility], [place] [address], [place], [place] [address]    Labs: CBC: Recent Labs  Lab 10/15/21 1914 10/17/21 0101 10/18/21 0028  WBC 18.4* 14.9* 10.6*  NEUTROABS 14.9*  --   --   HGB 12.6 10.4* 9.0*  HCT 37.8 31.0* 26.7*  MCV 102.7* 102.0* 101.9*  PLT 328 341 213   Basic Metabolic Panel: Recent Labs  Lab 10/15/21 1914 10/16/21 1841 10/17/21 0101 10/18/21 0028  NA 134* 134* 136 138  K 3.2* 4.0 3.4* 3.8  CL 102 106 105 108  CO2 20* 19* 22 22  GLUCOSE 122* 105* 101* 115*  BUN 11 9 8 7   CREATININE 1.26* 0.82 0.81 0.66  CALCIUM 8.8* 8.6* 8.6* 8.3*   Liver Function Tests: Recent Labs  Lab 10/15/21 1914 10/16/21 1841 10/17/21 0101 10/18/21 0028  AST 37 72* 77* 68*  ALT 35 49* 56* 63*  ALKPHOS 122 147* 165* 145*  BILITOT 1.9* 2.3* 1.8* 0.7  PROT 8.2* 6.9 6.6 6.0*  ALBUMIN 3.2* 2.7* 2.3* 2.2*   CBG: No results for input(s): "GLUCAP" in the last 168 hours.  Discharge time spent: greater than 30 minutes.  Signed: Estill Cotta, MD [facility] 10/18/2021

## 2021-10-20 LAB — CULTURE, BLOOD (ROUTINE X 2)
Culture: NO GROWTH
Culture: NO GROWTH
Special Requests: ADEQUATE
Special Requests: ADEQUATE

## 2021-10-22 LAB — CULTURE, BLOOD (ROUTINE X 2)
Culture: NO GROWTH
Special Requests: ADEQUATE

## 2023-05-24 ENCOUNTER — Other Ambulatory Visit: Payer: Self-pay

## 2023-05-24 ENCOUNTER — Emergency Department (HOSPITAL_BASED_OUTPATIENT_CLINIC_OR_DEPARTMENT_OTHER)

## 2023-05-24 ENCOUNTER — Emergency Department (HOSPITAL_BASED_OUTPATIENT_CLINIC_OR_DEPARTMENT_OTHER)
Admission: EM | Admit: 2023-05-24 | Discharge: 2023-05-24 | Disposition: A | Discharge status: Home / Self Care | Attending: Emergency Medicine | Admitting: Emergency Medicine

## 2023-05-24 ENCOUNTER — Encounter (HOSPITAL_BASED_OUTPATIENT_CLINIC_OR_DEPARTMENT_OTHER): Payer: Self-pay | Admitting: Pharmacy Technician

## 2023-05-24 DIAGNOSIS — S20212A Contusion of left front wall of thorax, initial encounter: Secondary | ICD-10-CM | POA: Diagnosis not present

## 2023-05-24 DIAGNOSIS — Y92410 Unspecified street and highway as the place of occurrence of the external cause: Secondary | ICD-10-CM | POA: Diagnosis not present

## 2023-05-24 DIAGNOSIS — S2090XA Unspecified superficial injury of unspecified parts of thorax, initial encounter: Secondary | ICD-10-CM | POA: Diagnosis present

## 2023-05-24 LAB — BASIC METABOLIC PANEL WITH GFR
Anion gap: 15 (ref 5–15)
BUN: 13 mg/dL (ref 6–20)
CO2: 21 mmol/L — ABNORMAL LOW (ref 22–32)
Calcium: 9.7 mg/dL (ref 8.9–10.3)
Chloride: 108 mmol/L (ref 98–111)
Creatinine, Ser: 0.8 mg/dL (ref 0.44–1.00)
GFR, Estimated: >60 mL/min (ref >60)
Glucose, Bld: 90 mg/dL (ref 70–99)
Potassium: 4.1 mmol/L (ref 3.5–5.1)
Sodium: 145 mmol/L (ref 135–145)

## 2023-05-24 LAB — CBC WITH DIFFERENTIAL/PLATELET
Abs Immature Granulocytes: 0.03 10*3/uL (ref 0.00–0.07)
Basophils Absolute: 0.1 10*3/uL (ref 0.0–0.1)
Basophils Relative: 1 %
Eosinophils Absolute: 0.1 10*3/uL (ref 0.0–0.5)
Eosinophils Relative: 2 %
HCT: 33.6 % — ABNORMAL LOW (ref 36.0–46.0)
Hemoglobin: 11.4 g/dL — ABNORMAL LOW (ref 12.0–15.0)
Immature Granulocytes: 0 %
Lymphocytes Relative: 25 %
Lymphs Abs: 1.7 10*3/uL (ref 0.7–4.0)
MCH: 34.2 pg — ABNORMAL HIGH (ref 26.0–34.0)
MCHC: 33.9 g/dL (ref 30.0–36.0)
MCV: 100.9 fL — ABNORMAL HIGH (ref 80.0–100.0)
Monocytes Absolute: 0.5 10*3/uL (ref 0.1–1.0)
Monocytes Relative: 8 %
Neutro Abs: 4.4 10*3/uL (ref 1.7–7.7)
Neutrophils Relative %: 64 %
Platelets: 444 10*3/uL — ABNORMAL HIGH (ref 150–400)
RBC: 3.33 MIL/uL — ABNORMAL LOW (ref 3.87–5.11)
RDW: 11.4 % — ABNORMAL LOW (ref 11.5–15.5)
WBC: 6.9 10*3/uL (ref 4.0–10.5)
nRBC: 0 % (ref 0.0–0.2)

## 2023-05-24 LAB — I-STAT CHEM 8, ED
BUN: 14 mg/dL (ref 6–20)
Calcium, Ion: 1.16 mmol/L (ref 1.15–1.40)
Chloride: 109 mmol/L (ref 98–111)
Creatinine, Ser: 0.9 mg/dL (ref 0.44–1.00)
Glucose, Bld: 88 mg/dL (ref 70–99)
HCT: 35 % — ABNORMAL LOW (ref 36.0–46.0)
Hemoglobin: 11.9 g/dL — ABNORMAL LOW (ref 12.0–15.0)
Potassium: 3.9 mmol/L (ref 3.5–5.1)
Sodium: 143 mmol/L (ref 135–145)
TCO2: 22 mmol/L (ref 22–32)

## 2023-05-24 LAB — PREGNANCY, URINE: Preg Test, Ur: NEGATIVE

## 2023-05-24 LAB — TROPONIN T, HIGH SENSITIVITY: Troponin T High Sensitivity: <15 ng/L (ref <19)

## 2023-05-24 MED ORDER — IOHEXOL 300 MG/ML  SOLN
75.0000 mL | Freq: Once | INTRAMUSCULAR | Status: AC | PRN
  Administered 2023-05-24: 75 mL via INTRAVENOUS

## 2023-05-24 MED ORDER — IBUPROFEN 800 MG PO TABS: 800.0000 mg | ORAL_TABLET | Freq: Three times a day (TID) | ORAL | 0 refills | Status: AC

## 2023-05-24 MED ORDER — MORPHINE SULFATE (PF) 4 MG/ML IV SOLN
4.0000 mg | Freq: Once | INTRAVENOUS | Status: AC
  Administered 2023-05-24: 4 mg via INTRAVENOUS
  Filled 2023-05-24: qty 1

## 2023-05-24 MED ORDER — KETOROLAC TROMETHAMINE 30 MG/ML IJ SOLN
30.0000 mg | Freq: Once | INTRAMUSCULAR | Status: AC
  Administered 2023-05-24: 30 mg via INTRAVENOUS
  Filled 2023-05-24: qty 1

## 2023-05-24 MED ORDER — CYCLOBENZAPRINE HCL 10 MG PO TABS: 10.0000 mg | ORAL_TABLET | Freq: Two times a day (BID) | ORAL | 0 refills | Status: AC | PRN

## 2023-05-24 MED ORDER — ONDANSETRON HCL 4 MG/2ML IJ SOLN
4.0000 mg | Freq: Once | INTRAMUSCULAR | Status: AC
  Administered 2023-05-24: 4 mg via INTRAVENOUS
  Filled 2023-05-24: qty 2
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
## 2023-05-26 ENCOUNTER — Emergency Department (HOSPITAL_COMMUNITY)
Admission: EM | Admit: 2023-05-26 | Discharge: 2023-05-27 | Disposition: A | Discharge status: Other Acute Inpatient Hospital | Source: Home / Self Care | Attending: *Deleted | Admitting: *Deleted

## 2023-05-26 ENCOUNTER — Encounter (HOSPITAL_COMMUNITY): Payer: Self-pay

## 2023-05-26 ENCOUNTER — Other Ambulatory Visit: Payer: Self-pay

## 2023-05-26 DIAGNOSIS — F29 Unspecified psychosis not due to a substance or known physiological condition: Secondary | ICD-10-CM | POA: Insufficient documentation

## 2023-05-26 DIAGNOSIS — R45851 Suicidal ideations: Secondary | ICD-10-CM | POA: Insufficient documentation

## 2023-05-26 DIAGNOSIS — Z79899 Other long term (current) drug therapy: Secondary | ICD-10-CM | POA: Insufficient documentation

## 2023-05-26 DIAGNOSIS — F329 Major depressive disorder, single episode, unspecified: Secondary | ICD-10-CM | POA: Insufficient documentation

## 2023-05-26 LAB — ACETAMINOPHEN LEVEL: Acetaminophen (Tylenol), Serum: <10 ug/mL — ABNORMAL LOW (ref 10–30)

## 2023-05-26 LAB — COMPREHENSIVE METABOLIC PANEL WITH GFR
ALT: 19 U/L (ref 0–44)
AST: 20 U/L (ref 15–41)
Albumin: 4 g/dL (ref 3.5–5.0)
Alkaline Phosphatase: 76 U/L (ref 38–126)
Anion gap: 9 (ref 5–15)
BUN: 19 mg/dL (ref 6–20)
CO2: 23 mmol/L (ref 22–32)
Calcium: 8.8 mg/dL — ABNORMAL LOW (ref 8.9–10.3)
Chloride: 103 mmol/L (ref 98–111)
Creatinine, Ser: 0.79 mg/dL (ref 0.44–1.00)
GFR, Estimated: >60 mL/min (ref >60)
Glucose, Bld: 89 mg/dL (ref 70–99)
Potassium: 3.4 mmol/L — ABNORMAL LOW (ref 3.5–5.1)
Sodium: 135 mmol/L (ref 135–145)
Total Bilirubin: 0.4 mg/dL (ref 0.0–1.2)
Total Protein: 8 g/dL (ref 6.5–8.1)

## 2023-05-26 LAB — CBC
HCT: 35.2 % — ABNORMAL LOW (ref 36.0–46.0)
Hemoglobin: 11.9 g/dL — ABNORMAL LOW (ref 12.0–15.0)
MCH: 34.5 pg — ABNORMAL HIGH (ref 26.0–34.0)
MCHC: 33.8 g/dL (ref 30.0–36.0)
MCV: 102 fL — ABNORMAL HIGH (ref 80.0–100.0)
Platelets: 441 10*3/uL — ABNORMAL HIGH (ref 150–400)
RBC: 3.45 MIL/uL — ABNORMAL LOW (ref 3.87–5.11)
RDW: 11.3 % — ABNORMAL LOW (ref 11.5–15.5)
WBC: 6.3 10*3/uL (ref 4.0–10.5)
nRBC: 0 % (ref 0.0–0.2)

## 2023-05-26 LAB — RAPID URINE DRUG SCREEN, HOSP PERFORMED
Amphetamines: NOT DETECTED
Barbiturates: NOT DETECTED
Benzodiazepines: NOT DETECTED
Cocaine: NOT DETECTED
Opiates: NOT DETECTED
Tetrahydrocannabinol: POSITIVE — AB

## 2023-05-26 LAB — ETHANOL: Alcohol, Ethyl (B): 25 mg/dL — ABNORMAL HIGH (ref <15)

## 2023-05-26 LAB — SALICYLATE LEVEL: Salicylate Lvl: <7 mg/dL — ABNORMAL LOW (ref 7.0–30.0)

## 2023-05-26 LAB — HCG, SERUM, QUALITATIVE: Preg, Serum: NEGATIVE

## 2023-05-26 MED ORDER — ONDANSETRON HCL 4 MG PO TABS: 4.0000 mg | ORAL_TABLET | Freq: Three times a day (TID) | ORAL | Status: DC | PRN

## 2023-05-26 MED ORDER — ACETAMINOPHEN 325 MG PO TABS: 650.0000 mg | ORAL_TABLET | ORAL | Status: DC | PRN

## 2023-05-26 NOTE — ED Notes (Signed)
 2 personal belonging bags in the [facility] [facility] cabinet

## 2023-05-26 NOTE — ED Triage Notes (Signed)
 Pt feels suicidal, states she has felt like this before, she has attempted to hang herself, cut herself, and overdose in the past. Pt has had 6 suicide attempts. No specific plan now, just wants help. No HI. No AVH.

## 2023-05-26 NOTE — ED Notes (Signed)
 Provided patient with warm blankets.
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
## 2023-05-27 ENCOUNTER — Inpatient Hospital Stay (HOSPITAL_COMMUNITY)
Admission: AD | Admit: 2023-05-27 | Discharge: 2023-05-29 | DRG: 885 | Disposition: A | Discharge status: Home / Self Care | Attending: Psychiatry | Admitting: Psychiatry

## 2023-05-27 ENCOUNTER — Encounter (HOSPITAL_COMMUNITY): Payer: Self-pay | Admitting: Psychiatry

## 2023-05-27 DIAGNOSIS — Z9152 Personal history of nonsuicidal self-harm: Secondary | ICD-10-CM

## 2023-05-27 DIAGNOSIS — R45851 Suicidal ideations: Secondary | ICD-10-CM | POA: Diagnosis present

## 2023-05-27 DIAGNOSIS — F1010 Alcohol abuse, uncomplicated: Secondary | ICD-10-CM | POA: Diagnosis present

## 2023-05-27 DIAGNOSIS — Z5902 Unsheltered homelessness: Secondary | ICD-10-CM | POA: Diagnosis not present

## 2023-05-27 DIAGNOSIS — F331 Major depressive disorder, recurrent, moderate: Principal | ICD-10-CM | POA: Diagnosis present

## 2023-05-27 DIAGNOSIS — F17210 Nicotine dependence, cigarettes, uncomplicated: Secondary | ICD-10-CM | POA: Diagnosis present

## 2023-05-27 DIAGNOSIS — Z5941 Food insecurity: Secondary | ICD-10-CM

## 2023-05-27 DIAGNOSIS — J45909 Unspecified asthma, uncomplicated: Secondary | ICD-10-CM | POA: Diagnosis present

## 2023-05-27 DIAGNOSIS — Z5982 Transportation insecurity: Secondary | ICD-10-CM

## 2023-05-27 DIAGNOSIS — F17290 Nicotine dependence, other tobacco product, uncomplicated: Secondary | ICD-10-CM | POA: Diagnosis present

## 2023-05-27 DIAGNOSIS — F1090 Alcohol use, unspecified, uncomplicated: Secondary | ICD-10-CM | POA: Insufficient documentation

## 2023-05-27 DIAGNOSIS — F909 Attention-deficit hyperactivity disorder, unspecified type: Secondary | ICD-10-CM | POA: Diagnosis present

## 2023-05-27 DIAGNOSIS — Z818 Family history of other mental and behavioral disorders: Secondary | ICD-10-CM

## 2023-05-27 DIAGNOSIS — Z653 Problems related to other legal circumstances: Secondary | ICD-10-CM

## 2023-05-27 DIAGNOSIS — Z9151 Personal history of suicidal behavior: Secondary | ICD-10-CM

## 2023-05-27 DIAGNOSIS — F1210 Cannabis abuse, uncomplicated: Secondary | ICD-10-CM | POA: Diagnosis present

## 2023-05-27 DIAGNOSIS — F1290 Cannabis use, unspecified, uncomplicated: Secondary | ICD-10-CM | POA: Insufficient documentation

## 2023-05-27 DIAGNOSIS — F329 Major depressive disorder, single episode, unspecified: Principal | ICD-10-CM | POA: Diagnosis present

## 2023-05-27 MED ORDER — HALOPERIDOL 5 MG PO TABS: 5.0000 mg | ORAL_TABLET | Freq: Three times a day (TID) | ORAL | Status: DC | PRN

## 2023-05-27 MED ORDER — LORAZEPAM 1 MG PO TABS
1.0000 mg | ORAL_TABLET | Freq: Two times a day (BID) | ORAL | Status: DC | PRN
  Filled 2023-05-27: qty 1

## 2023-05-27 MED ORDER — ALUM & MAG HYDROXIDE-SIMETH 200-200-20 MG/5ML PO SUSP: 30.0000 mL | ORAL | Status: DC | PRN

## 2023-05-27 MED ORDER — ACETAMINOPHEN 325 MG PO TABS
650.0000 mg | ORAL_TABLET | Freq: Four times a day (QID) | ORAL | Status: DC | PRN
  Administered 2023-05-28: 650 mg via ORAL
  Filled 2023-05-27: qty 2

## 2023-05-27 MED ORDER — LORAZEPAM 2 MG/ML IJ SOLN: 2.0000 mg | Freq: Three times a day (TID) | INTRAMUSCULAR | Status: DC | PRN

## 2023-05-27 MED ORDER — HALOPERIDOL LACTATE 5 MG/ML IJ SOLN: 10.0000 mg | Freq: Three times a day (TID) | INTRAMUSCULAR | Status: DC | PRN

## 2023-05-27 MED ORDER — DIPHENHYDRAMINE HCL 50 MG/ML IJ SOLN: 50.0000 mg | Freq: Three times a day (TID) | INTRAMUSCULAR | Status: DC | PRN

## 2023-05-27 MED ORDER — CYCLOBENZAPRINE HCL 10 MG PO TABS
10.0000 mg | ORAL_TABLET | Freq: Three times a day (TID) | ORAL | Status: DC | PRN
  Administered 2023-05-27 (×2): 10 mg via ORAL
  Filled 2023-05-27 (×2): qty 1

## 2023-05-27 MED ORDER — HALOPERIDOL LACTATE 5 MG/ML IJ SOLN: 5.0000 mg | Freq: Three times a day (TID) | INTRAMUSCULAR | Status: DC | PRN

## 2023-05-27 MED ORDER — IBUPROFEN 200 MG PO TABS
600.0000 mg | ORAL_TABLET | Freq: Three times a day (TID) | ORAL | Status: DC | PRN
Start: 2023-05-27 — End: 2023-05-27

## 2023-05-27 MED ORDER — TRAZODONE HCL 50 MG PO TABS
50.0000 mg | ORAL_TABLET | Freq: Every evening | ORAL | Status: DC | PRN
  Administered 2023-05-27 – 2023-05-28 (×2): 50 mg via ORAL
  Filled 2023-05-27 (×2): qty 1

## 2023-05-27 MED ORDER — DIPHENHYDRAMINE HCL 25 MG PO CAPS: 50.0000 mg | ORAL_CAPSULE | Freq: Three times a day (TID) | ORAL | Status: DC | PRN

## 2023-05-27 MED ORDER — CYCLOBENZAPRINE HCL 10 MG PO TABS
10.0000 mg | ORAL_TABLET | Freq: Three times a day (TID) | ORAL | Status: DC | PRN
  Administered 2023-05-27 – 2023-05-28 (×3): 10 mg via ORAL
  Filled 2023-05-27 (×3): qty 1

## 2023-05-27 MED ORDER — MAGNESIUM HYDROXIDE 400 MG/5ML PO SUSP: 30.0000 mL | Freq: Every day | ORAL | Status: DC | PRN

## 2023-05-27 MED ORDER — IBUPROFEN 600 MG PO TABS
600.0000 mg | ORAL_TABLET | Freq: Three times a day (TID) | ORAL | Status: DC | PRN
  Administered 2023-05-28: 600 mg via ORAL
  Filled 2023-05-27: qty 1

## 2023-05-27 MED ORDER — ONDANSETRON HCL 4 MG PO TABS: 4.0000 mg | ORAL_TABLET | Freq: Three times a day (TID) | ORAL | Status: DC | PRN

## 2023-05-27 NOTE — Progress Notes (Signed)
 Pt has been accepted to [facility] on 05/27/2023 Bed assignment: 407-1  Pt meets inpatient criteria per: [name] NP  Attending Physician will be: Dr. Alver Jobs, MD   Report can be called to: Adult unit: [phone]  Pt can arrive after discharges/ [facility] will update    Care Team Notified: [facility] Kathryn Parish RN, Arleen Lacer paramedic, Jacquelyn Blue RN, Lady Pier NP  [place] Daisean Brodhead LCSW-A   05/27/2023 1:38 PM

## 2023-05-27 NOTE — ED Notes (Signed)
 Pt in bed in nad at this time
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
## 2023-05-27 NOTE — ED Notes (Signed)
 Report called to Lanell Pinta, Charity fundraiser at [facility].
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
## 2023-05-27 NOTE — ED Notes (Signed)
 [employer] called to  transport pt. Dispatch had no ETA.

## 2023-05-27 NOTE — Progress Notes (Signed)
 Kim Fritz did not attend wrap up group.

## 2023-05-27 NOTE — ED Notes (Signed)
 Pt moved back to SAPU RM 37. Belongings placed in RM 37 locker. Pt also given her lunch tray.

## 2023-05-27 NOTE — Progress Notes (Signed)
   05/27/23 1800  Psych Admission Type (Psych Patients Only)  Admission Status Involuntary  Psychosocial Assessment  Patient Complaints Hopelessness;Depression;Anxiety  Eye Contact Brief  Facial Expression Flat;Sad  Affect Depressed  Speech Soft;Slow  Interaction Minimal  Motor Activity Slow  Appearance/Hygiene Disheveled;In scrubs  Behavior Characteristics Cooperative  Mood Depressed;Anxious  Thought Process  Coherency WDL  Content WDL  Delusions None reported or observed  Perception WDL  Hallucination None reported or observed  Judgment Impaired  Confusion None  Danger to Self  Current suicidal ideation? Denies  Danger to Others  Danger to Others None reported or observed

## 2023-05-27 NOTE — Progress Notes (Signed)
 Pt remains verbalizing anxiety. Provider paged and made aware.  PRN orders placed. Trazodone given to patient for insomnia.

## 2023-05-27 NOTE — Progress Notes (Signed)
 During the shift. Patient had an anklet on her leg, and pointed earrings shaped like a thunder bolt. Pt was made aware items were not allowed on the unit. Items were inventoried on belongings sheet and placed in locker. She also has a nose ring that was unable to be removed.  There was more suspected contraband in patient room. Colored unknown object seen under patient's body during safety 15 minute checks. Pt stated it was lip gloss.   This RN completed a second room search and body search with a MHT Jalyn. A pink and blue vape was found between the patient's chest. Pt educated about items allowed and not allowed on unit for safety. Item locked in her locker. AC notified.

## 2023-05-27 NOTE — ED Notes (Signed)
 In bed in nad at this time

## 2023-05-27 NOTE — ED Notes (Signed)
 Spoke with patient who stated she is still feeling the same suicidal thoughts. Pt states she is hungry, and in 6/10 body aches pain, I told her I will ask provider. Provider notified
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
## 2023-05-27 NOTE — ED Notes (Signed)
 Pt was noticed walking towards [facility] nurse's station. Pt then made right turn towards the ambulance bay doors to attempt to leave. This Clinical research associate followed pt while Medical sales representative. This writer was able to redirect and follow pt back to hallway C. Pt crying and loudly voicing that she hasn't eaten in the 16 hours she's been here and that nobody is helping her. This writer advised the pt that she is working on getting her a more private room set up. Charge RN went back to secure area to check that a room was available. Pt then escorted back to SAPU.
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
## 2023-05-27 NOTE — Progress Notes (Signed)
 Spoke with patient's aunt on the phone. Received permission from the patient. Patient agitated and visibly upset that she was involuntarily committed when she stated she came in voluntarily. She stated she wanted to sign out. Patient explained about her involuntarily status and made aware she cannot sign herself out.   She began crying and stated she has a job and she has a child she needs to take care of. She is very anxious and states she feels like this place is making her worse. Her aunt was also upset on the phone but she was educated on the patient's admission status. Patient remained crying stating she should have never gotten help and next time she is not going to tell anyone how she feels.   Pt provided with assurance and offered PRN medications but she declined.

## 2023-05-28 DIAGNOSIS — F1090 Alcohol use, unspecified, uncomplicated: Secondary | ICD-10-CM | POA: Insufficient documentation

## 2023-05-28 DIAGNOSIS — F331 Major depressive disorder, recurrent, moderate: Secondary | ICD-10-CM | POA: Diagnosis not present

## 2023-05-28 DIAGNOSIS — F1290 Cannabis use, unspecified, uncomplicated: Secondary | ICD-10-CM | POA: Insufficient documentation

## 2023-05-28 LAB — TSH: TSH: 2.484 u[IU]/mL (ref 0.350–4.500)

## 2023-05-28 MED ORDER — ALBUTEROL SULFATE HFA 108 (90 BASE) MCG/ACT IN AERS: 1.0000 | INHALATION_SPRAY | Freq: Four times a day (QID) | RESPIRATORY_TRACT | Status: DC | PRN

## 2023-05-28 MED ORDER — LOPERAMIDE HCL 2 MG PO CAPS: 2.0000 mg | ORAL_CAPSULE | ORAL | Status: DC | PRN

## 2023-05-28 MED ORDER — THIAMINE HCL 100 MG/ML IJ SOLN: 100.0000 mg | Freq: Once | INTRAMUSCULAR | Status: DC

## 2023-05-28 MED ORDER — ADULT MULTIVITAMIN W/MINERALS CH: 1.0000 | ORAL_TABLET | Freq: Every day | ORAL | 0 refills | Status: DC

## 2023-05-28 MED ORDER — NICOTINE 21 MG/24HR TD PT24: 21.0000 mg | MEDICATED_PATCH | Freq: Every day | TRANSDERMAL | Status: DC | PRN

## 2023-05-28 MED ORDER — CLONIDINE HCL 0.1 MG PO TABS
0.1000 mg | ORAL_TABLET | Freq: Four times a day (QID) | ORAL | Status: DC | PRN
  Administered 2023-05-29: 0.1 mg via ORAL
  Filled 2023-05-28 (×2): qty 1

## 2023-05-28 MED ORDER — ADULT MULTIVITAMIN W/MINERALS CH
1.0000 | ORAL_TABLET | Freq: Every day | ORAL | Status: DC
  Administered 2023-05-28 – 2023-05-29 (×2): 1 via ORAL
  Filled 2023-05-28 (×6): qty 1

## 2023-05-28 MED ORDER — LORAZEPAM 1 MG PO TABS: 1.0000 mg | ORAL_TABLET | Freq: Four times a day (QID) | ORAL | Status: DC | PRN

## 2023-05-28 MED ORDER — MENTHOL 3 MG MT LOZG
1.0000 | LOZENGE | OROMUCOSAL | Status: DC | PRN
  Administered 2023-05-28 (×2): 3 mg via ORAL
  Filled 2023-05-28 (×2): qty 9

## 2023-05-28 MED ORDER — ONDANSETRON 4 MG PO TBDP: 4.0000 mg | ORAL_TABLET | Freq: Four times a day (QID) | ORAL | Status: DC | PRN

## 2023-05-28 MED ORDER — HYDROXYZINE HCL 25 MG PO TABS: 25.0000 mg | ORAL_TABLET | Freq: Four times a day (QID) | ORAL | Status: DC | PRN

## 2023-05-28 MED ORDER — VITAMIN B-1 100 MG PO TABS
100.0000 mg | ORAL_TABLET | Freq: Every day | ORAL | Status: DC
  Administered 2023-05-29: 100 mg via ORAL
  Filled 2023-05-28 (×3): qty 1

## 2023-05-28 MED ORDER — MELATONIN 3 MG PO TABS: 3.0000 mg | ORAL_TABLET | Freq: Every evening | ORAL | Status: DC | PRN
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
## 2023-05-28 NOTE — Progress Notes (Signed)
   05/28/23 1300  Psych Admission Type (Psych Patients Only)  Admission Status Voluntary  Psychosocial Assessment  Patient Complaints Anxiety  Eye Contact Fair  Facial Expression Flat  Affect Depressed  Speech Logical/coherent  Interaction Minimal  Motor Activity Other (Comment) (WNL)  Appearance/Hygiene Unremarkable  Behavior Characteristics Cooperative  Mood Anxious  Thought Process  Coherency WDL  Content Blaming others  Delusions None reported or observed  Perception WDL  Hallucination None reported or observed  Judgment Limited  Confusion None  Danger to Self  Current suicidal ideation? Denies  Agreement Not to Harm Self Yes  Description of Agreement verbal  Danger to Others  Danger to Others None reported or observed

## 2023-05-28 NOTE — Progress Notes (Signed)
 Kim Fritz did not attend wrap up group.

## 2023-05-28 NOTE — Progress Notes (Signed)
 Attempted EKG. Machine would not work. Will attempt again.

## 2023-05-28 NOTE — Progress Notes (Signed)
 After eating breakfast patient stated she did not feel good. Pt stated her symptoms are stuffy nose, itchy throat, fatigue and dizziness. Pt encouraged to drink fluids and rest in bed. Report given to next shift RN about cold symptoms.

## 2023-05-28 NOTE — Group Note (Signed)
 LCSW Group Therapy Note   Group Date: 05/28/2023 Start Time: 1100 End Time: 1200   Participation:  did not attend  Type of Therapy:  Group Therapy  Title: Healing Flames: Navigating Anger with Compassion  Objective:  Foster self-awareness and promote compassion toward oneself and others when dealing with anger.  Goals: Help participants understand the underlying emotions and needs fueling anger. Provide coping strategies for healthier emotional expression and anger management.  Summary: This session explored anger as a volcano - an explosion driven by deeper feelings and unmet needs. Participants learned to identify anger triggers and underlying emotions, then practiced coping strategies like deep breathing, physical activity, and journaling. The group discussed healthy ways to manage anger before it escalates, using both personal reflection and shared experiences.  Therapeutic Modalities: Cognitive Behavioral Therapy (CBT): Challenging thoughts that fuel anger. Mindfulness: Increasing awareness of emotions and sensations.   Kim Fritz O Kim Fritz, LCSWA 05/28/2023  1:08 PM

## 2023-05-28 NOTE — Plan of Care (Signed)

## 2023-05-28 NOTE — Plan of Care (Signed)
   Problem: Education: Goal: Emotional status will improve Outcome: Progressing Goal: Mental status will improve Outcome: Progressing   Problem: Activity: Goal: Interest or engagement in activities will improve Outcome: Progressing Goal: Sleeping patterns will improve Outcome: Progressing

## 2023-05-28 NOTE — Progress Notes (Signed)
   05/28/23 2145  Psych Admission Type (Psych Patients Only)  Admission Status Voluntary  Psychosocial Assessment  Patient Complaints Anxiety  Eye Contact Fair  Facial Expression Anxious  Affect Sad  Speech Logical/coherent  Interaction Cautious  Motor Activity Slow  Appearance/Hygiene Unremarkable  Behavior Characteristics Cooperative  Mood Pleasant  Aggressive Behavior  Effect No apparent injury  Thought Process  Coherency WDL;Circumstantial  Content Blaming others  Delusions None reported or observed  Perception WDL  Hallucination None reported or observed  Judgment Limited  Confusion None  Danger to Self  Current suicidal ideation? Denies  Danger to Others  Danger to Others None reported or observed

## 2023-05-29 ENCOUNTER — Encounter (HOSPITAL_COMMUNITY): Payer: Self-pay

## 2023-05-29 DIAGNOSIS — F331 Major depressive disorder, recurrent, moderate: Secondary | ICD-10-CM | POA: Diagnosis not present

## 2023-05-29 MED ORDER — TRAZODONE HCL 50 MG PO TABS: 50.0000 mg | ORAL_TABLET | Freq: Every evening | ORAL | 0 refills | Status: AC | PRN

## 2023-05-29 MED ORDER — VITAMIN B-1 100 MG PO TABS: 100.0000 mg | ORAL_TABLET | Freq: Every day | ORAL | 0 refills | Status: AC

## 2023-05-29 MED ORDER — ADULT MULTIVITAMIN W/MINERALS CH: 1.0000 | ORAL_TABLET | Freq: Every day | ORAL | Status: AC

## 2023-05-29 MED ORDER — ALBUTEROL SULFATE HFA 108 (90 BASE) MCG/ACT IN AERS: 1.0000 | INHALATION_SPRAY | Freq: Four times a day (QID) | RESPIRATORY_TRACT | 0 refills | Status: AC | PRN

## 2023-05-29 MED ORDER — NICOTINE 21 MG/24HR TD PT24
21.0000 mg | MEDICATED_PATCH | Freq: Every day | TRANSDERMAL | 0 refills | Status: AC | PRN
Start: 2023-05-29 — End: ?

## 2023-05-29 NOTE — Discharge Instructions (Signed)

## 2023-05-29 NOTE — Group Note (Signed)
 Recreation Therapy Group Note   Group Topic:Leisure Education  Group Date: 05/29/2023 Start Time: 0930 End Time: 1005 Facilitators: Damichael Hofman-McCall, LRT,CTRS Location: [address] Dayroom   Group Topic: Leisure Education   Goal Area(s) Addresses:  Patient will successfully identify positive leisure and recreation activities.  Patient will acknowledge benefits of participation in healthy leisure activities post discharge.  Patient will actively work with peers toward a shared goal.   Intervention: Cooperative Group Game    Activity: Keep It Contractor. Patients were seated in a circle and given a beach ball. Patients were to toss the ball back and forth between each other. Patients were to remain seated throughout group. LRT timed patients on how long they could keep the ball moving without it coming to a stop. If the ball came to a complete stop, LRT would restart the timer.   Education:  Teacher, English as a foreign language, Leisure as Merchant navy officer, Programmer, applications, Building control surveyor   Education Outcome: Acknowledges education/In group clarification offered/Needs additional education   Affect/Mood: N/A   Participation Level: Did not attend    Clinical Observations/Individualized Feedback:     Plan: Continue to engage patient in RT group sessions 2-3x/week.   Gradie Ohm-McCall, LRT,CTRS 05/29/2023 1:49 PM

## 2023-05-29 NOTE — Plan of Care (Signed)
  Problem: Activity: Goal: Interest or engagement in activities will improve Outcome: Progressing   Problem: Coping: Goal: Ability to verbalize frustrations and anger appropriately will improve Outcome: Progressing   Problem: Physical Regulation: Goal: Ability to maintain clinical measurements within normal limits will improve Outcome: Progressing   

## 2023-05-29 NOTE — Progress Notes (Signed)
  [facility] Adult Case Management Discharge Plan :  Will you be returning to the same living situation after discharge:  Yes,  pt is returning to the same environment at discharge  At discharge, do you have transportation home?: Yes,  pt will be picked up around 6pm Do you have the ability to pay for your medications: Yes,  pt has active health insurance coverage  Release of information consent forms completed and in the chart;  Patient's signature needed at discharge.  Patient to Follow up at:  Follow-up Information     [place] [facility] Go on 06/01/2023.   Specialty: Professional Counselor Why: Please go to this provider on 06/01/23 at 9:00 am for an assessment, to obtain an appointment for therapy services. You may also go on Monday through Friday, from 9 am to 1 pm. Contact information: [address]  [address] [place] [address] [phone]         [facility]. Schedule an appointment as soon as possible for a visit.   Specialty: Behavioral Health Why: Please call this provider if you wish to schedule an appointment for an assessment, to obtain substance abuse intensive outpatient therapy services. Contact information: [address] [place] [address] [phone]         [facility] Call.   Why: Please call this provider if you wish to schedule an appointment for medication management services. Contact information: [address] [place] [address] [phone]                 Next level of care provider has access to [facility] Link:no  Safety Planning and Suicide Prevention discussed: Yes,  Child's father, Audria Blight [phone]     Has patient been referred to the Quitline?: Patient refused referral for treatment  Patient has been referred for addiction treatment: Patient refused referral for treatment.  Vonzell Guerin, LCSWA 05/29/2023, 9:01 AM

## 2023-05-29 NOTE — Progress Notes (Signed)
 Kim Fritz D/C'd Home per MD order.  Discussed with the patient and all questions fully answered.  An After Visit Summary was printed and given to the patient. Patient received prescription.  D/c education completed with patient including follow up instructions, medication list, d/c activities limitations if indicated, with other d/c instructions as indicated by MD - patient able to verbalize understanding, all questions fully answered.   Patient instructed to return to ED, call [phone], or call MD for any changes in condition.   Patient escorted to the main entrance, and D/C home via private auto.  [name] 05/29/2023 6:48 PM

## 2023-05-29 NOTE — Progress Notes (Signed)
   05/29/23 0800  Psych Admission Type (Psych Patients Only)  Admission Status Voluntary  Psychosocial Assessment  Patient Complaints Anxiety  Eye Contact Fair  Facial Expression Anxious  Affect Sad  Speech Logical/coherent  Interaction Cautious  Motor Activity Slow  Appearance/Hygiene Unremarkable  Behavior Characteristics Cooperative;Appropriate to situation  Mood Pleasant  Aggressive Behavior  Effect No apparent injury  Thought Process  Coherency WDL  Content Blaming self  Delusions None reported or observed  Perception WDL  Hallucination None reported or observed  Judgment Limited  Confusion None  Danger to Self  Current suicidal ideation? Denies  Agreement Not to Harm Self Yes  Description of Agreement Verbal  Danger to Others  Danger to Others None reported or observed

## 2023-05-29 NOTE — [facility]HH Group Notes (Signed)
Patient did not attend the goals group. 

## 2023-05-29 NOTE — Progress Notes (Signed)
 Pt had elevated bp per flowsheet, pt given PRN Clonidine  per [facility]

## 2023-05-29 NOTE — Discharge Summary (Signed)
 Physician Discharge Summary Note  Patient:  Kim Fritz is an 28 y.o., female MRN:  [id number] DOB:  May 18, 1995 Patient phone:  [phone] (home)  Patient address:   [address] [place] [address],  Total Time spent with patient: 30 minutes  Date of Admission:  05/27/2023 Date of Discharge: 05/29/2023  Reason for Admission: Kim Fritz is a 28 y.o. female  with a past psychiatric history of MDD, ADHD, bipolar disorder. Patient initially arrived to [facility] on 4/29 for suicidal ideation, and admitted to [facility] under IVC on 5/1 for passive SI, acute safety concerns and crisis stabalization. PPHx is significant for MDD, ADHD, bipolar disorder, multiple past psychiatric hospitalizations, and  history of 6 Suicide Attempts (last at [age]), Self Injurious Behavior (last 15-17), and multiple Prior Psychiatric Hospitalizations. PMHx is significant for asthma, anemia, prediabetes.   Principal Problem: MDD (major depressive disorder) Discharge Diagnoses: Principal Problem:   MDD (major depressive disorder) Active Problems:   MDD (major depressive disorder), recurrent episode, moderate (HCC)   Alcohol use disorder   Cannabis use disorder  Past Psychiatric History:  Current Psychiatrist: None Current Therapist: None Previous Psychiatric Diagnoses: MDD, ADHD, bipolar Current psychiatric medications: None Psychiatric medication history/compliance: Noncompliant,  Psychiatric Hospitalization hx: Reports history of multiple psychiatric hospitalizations as an adolescent Psychotherapy hx: Previously saw multiple therapists Neuromodulation history: No history of ECT or TMS History of suicide (obtained from HPI): Reports a history of at least 6 suicide attempts in the past, last one was 19 or 16 History of homicide or aggression (obtained in HPI): History of legal charges assault with deadly weapon and conspiracy.  Patient denies this.  Past Medical History:  Past Medical History:   Diagnosis Date   ADHD (attention deficit hyperactivity disorder)    Anemia    Asthma    Headache    Insomnia    Pre-diabetes    History reviewed. No pertinent surgical history. Family History:  Family History  Problem Relation Age of Onset   ADD / ADHD Mother    Alcohol abuse Mother    Alcohol abuse Father    Hypertension Sister    ADD / ADHD Sister    Asthma Maternal Aunt    Cancer Maternal Aunt    ADD / ADHD Maternal Grandmother    Vision loss Maternal Grandmother    Family Psychiatric  History:  Patient reports mom and maternal grandmother have bipolar disorder. She reports they are not on any medications. She reports she does not know about her father.   Social History:  Social History   Substance and Sexual Activity  Alcohol Use Yes   Comment: Drinks usually on the weekends     Social History   Substance and Sexual Activity  Drug Use No    Social History   Socioeconomic History   Marital status: Significant Other    Spouse name: Not on file   Number of children: Not on file   Years of education: Not on file   Highest education level: Not on file  Occupational History   Occupation: Kim Fritz  Tobacco Use   Smoking status: Every Day    Current packs/day: 0.50    Types: Cigarettes   Smokeless tobacco: Never   Tobacco comments:    Patient reported quitting previously in 2020, but currently admits to smoking again.  Vaping Use   Vaping status: Never Used  Substance and Sexual Activity   Alcohol use: Yes    Comment: Drinks usually on  the weekends   Drug use: No   Sexual activity: Yes    Partners: Male  Other Topics Concern   Not on file  Social History Narrative   Not on file   Social Drivers of Health   Financial Resource Strain: Not on file  Food Insecurity: Food Insecurity Present (05/27/2023)   Hunger Vital Sign    Worried About Running Out of Food in the Last Year: Sometimes true    Ran Out of Food in the Last Year: Sometimes  true  Transportation Needs: Unmet Transportation Needs (05/27/2023)   PRAPARE - Administrator, Civil Service (Medical): Yes    Lack of Transportation (Non-Medical): Yes  Physical Activity: Not on file  Stress: Not on file  Social Connections: Not on file    Hospital Course:   During the patient's hospitalization, patient had extensive initial psychiatric evaluation, and follow-up psychiatric evaluations every day.   Psychiatric diagnoses provided upon initial assessment:  -MDD -Alcohol use disorder -Cannabis use disorder   Patient's psychiatric medications were adjusted on admission:  -None, patient declined psychotropic medications   During the hospitalization, other adjustments were made to the patient's psychiatric medication regimen:  -none, patient declined   Patient's care was discussed during the interdisciplinary team meeting every day during the hospitalization.   Gradually, patient started adjusting to milieu. Patient requested discharge the day that she came to the hospital. The patient was evaluated each day by a clinical provider to ascertain response to treatment. Improvement was noted by the patient's report of decreasing symptoms, improved sleep and appetite, affect, medication tolerance, behavior, and participation in unit programming.  Patient was asked each day to complete a self inventory noting mood, mental status, pain, new symptoms, anxiety and concerns.     Symptoms were reported as significantly decreased or resolved completely by discharge.    On day of discharge, the patient reports that their mood is stable. The patient denied having suicidal thoughts for more than 48 hours prior to discharge.  Patient denies having homicidal thoughts.  Patient denies having auditory hallucinations.  Patient denies any visual hallucinations or other symptoms of psychosis. The patient was motivated to continue taking medication with a goal of continued improvement  in mental health. Patient reported that she is ready for discharge. Patient reported that coming to the hospital was helpful because she was able to have time away from her stressors for a couple of days. She rates her depression, anxiety, and anger as 4/10, decreased from 9/10 on admission. She reports that her mood is "fine, ready to go" and she reports that her former partner Geralyn Knee will come pick her up and her daughter will also be in the car. She is future-oriented and motivated to go to the car dealership tomorrow to look at new cars as well as go to work. She reports in the future if she is having increased suicidal thoughts she will go to her daughter, take a walk in the park. She reports that she can talk with Geralyn Knee and aunt. She was also advised she can call [phone] or 911 in the future. She was offered anti-depressant during hospitalization but continued to decline this. She is also looking forward to getting connected with therapist after discharge.    The patient reports their target psychiatric symptoms of depression responded well to the psychiatric hospitalization. Supportive psychotherapy was provided to the patient. The patient also participated in regular group therapy while hospitalized. Coping skills, problem solving as well  as relaxation therapies were also part of the unit programming.   Labs were reviewed with the patient, and abnormal results were discussed with the patient.   The patient is able to verbalize their individual safety plan to this provider.   Behavioral Events: none   # It is recommended to the patient to continue psychiatric medications as prescribed, after discharge from the hospital.    # It is recommended to the patient to follow up with your outpatient psychiatric provider and PCP.  # It was discussed with the patient, the impact of alcohol, drugs, tobacco have been there overall psychiatric and medical wellbeing, and total abstinence from substance use was  recommended to the patient.  # Prescriptions provided or sent directly to preferred pharmacy at discharge. Patient agreeable to plan. Given opportunity to ask questions. Appears to feel comfortable with discharge.    # In the event of worsening symptoms, the patient is instructed to call the crisis hotline, 911 and or go to the nearest ED for appropriate evaluation and treatment of symptoms. To follow-up with primary care provider for other medical issues, concerns and or health care needs  # Patient was discharged to a friend's home with a plan to follow up as noted below.  Physical Findings: AIMS:  , ,  ,  ,    CIWA:  CIWA-Ar Total: 0 COWS:     Musculoskeletal: Strength & Muscle Tone: within normal limits Gait & Station: normal Patient leans: N/A  Psychiatric Specialty Exam Presentation  General Appearance: Appropriate for Environment   Eye Contact:Fair   Speech:Clear and Coherent   Speech Volume:Normal   Mood and Affect  Mood: Euthymic   Affect:Congruent     Thought Process  Thought Processes:Goal Directed   Descriptions of Associations:Intact   Orientation:Full (Time, Place and Person)   Thought Content: Logical   History of Schizophrenia/Schizoaffective disorder:No   Duration of Psychotic Symptoms: None Hallucinations:Hallucinations: None   Ideas of Reference:None   Suicidal Thoughts:Suicidal Thoughts: No   Homicidal Thoughts:Homicidal Thoughts: No     Sensorium  Memory:Immediate Fair   Judgment: fair, improved   Insight: fair, improved     Executive Functions  Concentration:Fair   Attention Span:Fair   Recall:Fair   Fund of Knowledge:Fair   Language:Fair     Psychomotor Activity  Psychomotor Activity:Psychomotor Activity: Normal     Assets  Assets:Communication Skills; Desire for Improvement; Housing; Physical Health; Resilience     Sleep  Sleep:Sleep: Fair   Assets  Assets:Communication Skills; Desire for Improvement;  Housing; Physical Health; Resilience   Physical Exam ROS Physical Exam Constitutional:      Appearance: the patient is not toxic-appearing.  Pulmonary:     Effort: Pulmonary effort is normal.  Neurological:     General: No focal deficit present.     Mental Status: the patient is alert and oriented to person, place, and time.   Review of Systems  Respiratory:  Negative for shortness of breath.   Cardiovascular:  Negative for chest pain.  Gastrointestinal:  Negative for abdominal pain, constipation, diarrhea, nausea and vomiting.  Neurological:  Negative for headaches.  HEENT: positive for sore throat, stuffy nose.  Blood pressure (!) 128/102, pulse 90, temperature 99 F (37.2 C), temperature source Oral, resp. rate 18, height 5\' 5"  (1.651 m), weight 86.2 kg, SpO2 100%, unknown if currently breastfeeding. Body mass index is 31.62 kg/m.  Social History   Tobacco Use  Smoking Status Every Day   Current packs/day: 0.50   Types:  Cigarettes  Smokeless Tobacco Never  Tobacco Comments   Patient reported quitting previously in 2020, but currently admits to smoking again.   Tobacco Cessation:  A prescription for an FDA-approved tobacco cessation medication provided at discharge  Blood Alcohol level:  Lab Results  Component Value Date   ETH 25 (H) 05/26/2023    Metabolic Disorder Labs:  No results found for: "HGBA1C", "MPG" No results found for: "PROLACTIN" No results found for: "CHOL", "TRIG", "HDL", "CHOLHDL", "VLDL", "LDLCALC"  Discharge destination:  Other:  friend's home  Is patient on multiple antipsychotic therapies at discharge:  No   Has Patient had three or more failed trials of antipsychotic monotherapy by history:  No  Recommended Plan for Multiple Antipsychotic Therapies: NA  Discharge Instructions     Call MD for:  difficulty breathing, headache or visual disturbances   Complete by: As directed    Call MD for:  extreme fatigue   Complete by: As directed     Call MD for:  hives   Complete by: As directed    Call MD for:  persistant dizziness or light-headedness   Complete by: As directed    Call MD for:  persistant nausea and vomiting   Complete by: As directed    Call MD for:  severe uncontrolled pain   Complete by: As directed    Call MD for:  temperature >100.4   Complete by: As directed    Diet - low sodium heart healthy   Complete by: As directed    Increase activity slowly   Complete by: As directed       Allergies as of 05/29/2023       Reactions   Penicillins Hives, Itching        Medication List     TAKE these medications      Indication  albuterol  108 (90 Base) MCG/ACT inhaler Commonly known as: VENTOLIN  HFA Inhale 1-2 puffs into the lungs every 6 (six) hours as needed for wheezing or shortness of breath.  Indication: Asthma   BAYER BACK & BODY PO Take 1-2 tablets by mouth every 6 (six) hours as needed (Pain).  Indication: Pain   cyclobenzaprine  10 MG tablet Commonly known as: FLEXERIL  Take 1 tablet (10 mg total) by mouth 2 (two) times daily as needed for up to 10 days for muscle spasms.  Indication: Muscle Spasm   ibuprofen  800 MG tablet Commonly known as: ADVIL  Take 1 tablet (800 mg total) by mouth 3 (three) times daily for 30 doses. What changed:  when to take this reasons to take this  Indication: Pain   multivitamin with minerals Tabs tablet Take 1 tablet by mouth daily.  Indication: Abuse or Misuse of Alcohol   nicotine  21 mg/24hr patch Commonly known as: NICODERM CQ  - dosed in mg/24 hours Place 1 patch (21 mg total) onto the skin daily as needed (Nicotine  dependence).  Indication: Nicotine  Addiction   thiamine  100 MG tablet Commonly known as: Vitamin B-1 Take 1 tablet (100 mg total) by mouth daily. Start taking on: May 30, 2023  Indication: alcohol use disorder   traZODone  50 MG tablet Commonly known as: DESYREL  Take 1 tablet (50 mg total) by mouth at bedtime as needed for sleep.   Indication: Trouble Sleeping        Follow-up Information     [facility] Go on 06/01/2023.   Specialty: Professional Counselor Why: Please go to this provider on 06/01/23 at 9:00 am for an assessment,  to obtain an appointment for therapy services. You may also go on Monday through Friday, from 9 am to 1 pm. Contact information: [address]  [address] [place] [address] [phone]         [facility]. Schedule an appointment as soon as possible for a visit.   Specialty: Behavioral Health Why: Please call this provider if you wish to schedule an appointment for an assessment, to obtain substance abuse intensive outpatient therapy services. Contact information: [address] [place] [address] [phone]         [facility] Call.   Why: Please call this provider if you wish to schedule an appointment for medication management services. Contact information: [address] [place] [address] [phone]                 Discharge recommendations:  Activity: as tolerated   Diet: heart healthy   # It is recommended to the patient to continue psychiatric medications as prescribed, after discharge from the hospital.     # It is recommended to the patient to follow up with your outpatient psychiatric provider -instructions on appointment date, time, and address (location) are provided to you in discharge paperwork   # Follow-up with outpatient primary care doctor and other specialists -for management of chronic medical disease, including:  -asthma, anemia   # Testing: Follow-up with outpatient provider for abnormal lab results:  K 3.4 Hgb 11.9, MCV 102 Alc 25, UDS+THC   # It was discussed with the patient, the impact of alcohol, drugs, tobacco have been there overall psychiatric and medical wellbeing, and total abstinence from substance use was recommended to the patient.   # Prescriptions  provided or sent directly to preferred pharmacy at discharge. Patient agreeable to plan. Given opportunity to ask questions. Appears to feel comfortable with discharge.    # In the event of worsening symptoms, the patient is instructed to call the crisis hotline, 911 and or go to the nearest ED for appropriate evaluation and treatment of symptoms. To follow-up with primary care provider for other medical issues, concerns and or health care needs  Patient agrees with D/C instructions and plan.   Total Time Spent in Direct Patient Care:  I personally spent 20 minutes on the unit in direct patient care. The direct patient care time included face-to-face time with the patient, reviewing the patient's chart, communicating with other professionals, and coordinating care. Greater than 50% of this time was spent in counseling or coordinating care with the patient regarding goals of hospitalization, psycho-education, and discharge planning needs.   This case was discussed with attending Dr. Linnie Riches who agrees with the above formulated treatment plan. Please see attending attestation for additional details.   This note was created using a voice recognition software as a result there may be grammatical errors inadvertently enclosed that do not reflect the nature of this encounter. Every attempt is made to correct such errors.    Signed: Norbert Bean, MD, PGY-2 05/29/2023, 9:52 AM
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
## 2023-05-29 NOTE — Plan of Care (Signed)
  Problem: Education: Goal: Knowledge of [facility] Education information/materials will improve 05/29/2023 1843 by [name], RN Outcome: Completed/Met 05/29/2023 1810 by [name], RN Outcome: Progressing Goal: Emotional status will improve 05/29/2023 1843 by [name], RN Outcome: Completed/Met 05/29/2023 1810 by [name], RN Outcome: Progressing Goal: Mental status will improve 05/29/2023 1843 by [name], RN Outcome: Completed/Met 05/29/2023 1810 by [name], RN Outcome: Progressing Goal: Verbalization of understanding the information provided will improve 05/29/2023 1843 by [name], RN Outcome: Completed/Met 05/29/2023 1810 by [name], RN Outcome: Progressing   Problem: Activity: Goal: Interest or engagement in activities will improve 05/29/2023 1843 by [name], RN Outcome: Completed/Met 05/29/2023 1810 by [name], RN Outcome: Progressing Goal: Sleeping patterns will improve 05/29/2023 1843 by [name], RN Outcome: Completed/Met 05/29/2023 1810 by [name], RN Outcome: Progressing   Problem: Coping: Goal: Ability to verbalize frustrations and anger appropriately will improve 05/29/2023 1843 by [name], RN Outcome: Completed/Met 05/29/2023 1810 by [name], RN Outcome: Progressing Goal: Ability to demonstrate self-control will improve 05/29/2023 1843 by [name], RN Outcome: Completed/Met 05/29/2023 1810 by [name], RN Outcome: Progressing   Problem: Health Behavior/Discharge Planning: Goal: Identification of resources available to assist in meeting health care needs will improve 05/29/2023 1843 by [name], RN Outcome: Completed/Met 05/29/2023 1810 by [name], RN Outcome: Progressing Goal: Compliance with treatment plan for underlying cause of condition will improve 05/29/2023 1843 by [name], RN Outcome:  Completed/Met 05/29/2023 1810 by [name], RN Outcome: Progressing   Problem: Physical Regulation: Goal: Ability to maintain clinical measurements within normal limits will improve 05/29/2023 1843 by [name], RN Outcome: Completed/Met 05/29/2023 1810 by [name], RN Outcome: Progressing   Problem: Safety: Goal: Periods of time without injury will increase 05/29/2023 1843 by [name], RN Outcome: Completed/Met 05/29/2023 1810 by [name], RN Outcome: Progressing

## 2023-05-29 NOTE — BH IP Treatment Plan (Signed)
 Interdisciplinary Treatment and Diagnostic Plan Update  05/29/2023 Time of Session: 10:45 AM  Kim Fritz MRN: [id number]  Principal Diagnosis: MDD (major depressive disorder)  Secondary Diagnoses: Principal Problem:   MDD (major depressive disorder) Active Problems:   MDD (major depressive disorder), recurrent episode, moderate (HCC)   Alcohol use disorder   Cannabis use disorder   Current Medications:  Current Facility-Administered Medications  Medication Dose Route Frequency Provider Last Rate Last Admin   acetaminophen  (TYLENOL ) tablet 650 mg  650 mg Oral Q6H PRN Weber, Kyra A, NP   650 mg at 05/28/23 1619   albuterol  (VENTOLIN  HFA) 108 (90 Base) MCG/ACT inhaler 1-2 puff  1-2 puff Inhalation Q6H PRN Norbert Bean, MD       alum & mag hydroxide-simeth (MAALOX/MYLANTA) 200-200-20 MG/5ML suspension 30 mL  30 mL Oral Q4H PRN Weber, Kyra A, NP       cloNIDine  (CATAPRES ) tablet 0.1 mg  0.1 mg Oral Q6H PRN Linnie Riches, Nadir, MD   0.1 mg at 05/29/23 6578   cyclobenzaprine  (FLEXERIL ) tablet 10 mg  10 mg Oral TID PRN Chien, Stephanie, MD   10 mg at 05/28/23 2113   haloperidol  (HALDOL ) tablet 5 mg  5 mg Oral TID PRN Weber, Kyra A, NP       And   diphenhydrAMINE  (BENADRYL ) capsule 50 mg  50 mg Oral TID PRN Weber, Kyra A, NP       haloperidol  lactate (HALDOL ) injection 5 mg  5 mg Intramuscular TID PRN Weber, Kyra A, NP       And   diphenhydrAMINE  (BENADRYL ) injection 50 mg  50 mg Intramuscular TID PRN Weber, Kyra A, NP       And   LORazepam  (ATIVAN ) injection 2 mg  2 mg Intramuscular TID PRN Weber, Kyra A, NP       haloperidol  lactate (HALDOL ) injection 10 mg  10 mg Intramuscular TID PRN Weber, Kyra A, NP       And   diphenhydrAMINE  (BENADRYL ) injection 50 mg  50 mg Intramuscular TID PRN Weber, Kyra A, NP       And   LORazepam  (ATIVAN ) injection 2 mg  2 mg Intramuscular TID PRN Weber, Kyra A, NP       hydrOXYzine  (ATARAX ) tablet 25 mg  25 mg Oral Q6H PRN Chien, Stephanie, MD       ibuprofen   (ADVIL ) tablet 600 mg  600 mg Oral TID PRN Weber, Kyra A, NP   600 mg at 05/28/23 4696   loperamide  (IMODIUM ) capsule 2-4 mg  2-4 mg Oral PRN Chien, Stephanie, MD       LORazepam  (ATIVAN ) tablet 1 mg  1 mg Oral Q6H PRN Chien, Stephanie, MD       magnesium  hydroxide (MILK OF MAGNESIA) suspension 30 mL  30 mL Oral Daily PRN Weber, Kyra A, NP       melatonin tablet 3 mg  3 mg Oral QHS PRN Chien, Stephanie, MD       menthol -cetylpyridinium (CEPACOL) lozenge 3 mg  1 lozenge Oral PRN Chien, Stephanie, MD   3 mg at 05/28/23 1857   multivitamin with minerals tablet 1 tablet  1 tablet Oral Daily Chien, Stephanie, MD   1 tablet at 05/29/23 2952   nicotine  (NICODERM CQ  - dosed in mg/24 hours) patch 21 mg  21 mg Transdermal Daily PRN Chien, Stephanie, MD       ondansetron  (ZOFRAN -ODT) disintegrating tablet 4 mg  4 mg Oral Q6H PRN Norbert Bean, MD  thiamine  (Vitamin B-1) tablet 100 mg  100 mg Oral Daily Chien, Stephanie, MD   100 mg at 05/29/23 0818   thiamine  (VITAMIN B1) injection 100 mg  100 mg Intramuscular Once Chien, Stephanie, MD       traZODone  (DESYREL ) tablet 50 mg  50 mg Oral QHS PRN [name], NP   50 mg at 05/28/23 2113   PTA Medications: Medications Prior to Admission  Medication Sig Dispense Refill Last Dose/Taking   Aspirin-Caffeine (BAYER BACK & BODY PO) Take 1-2 tablets by mouth every 6 (six) hours as needed (Pain).      cyclobenzaprine  (FLEXERIL ) 10 MG tablet Take 1 tablet (10 mg total) by mouth 2 (two) times daily as needed for up to 10 days for muscle spasms. 20 tablet 0    ibuprofen  (ADVIL ) 800 MG tablet Take 1 tablet (800 mg total) by mouth 3 (three) times daily for 30 doses. (Patient taking differently: Take 800 mg by mouth 3 (three) times daily as needed for moderate pain (pain score 4-6).) 30 tablet 0     Patient Stressors: Financial difficulties   Substance abuse   Other: homelessness    Patient Strengths: Manufacturing systems engineer  Physical Health   Treatment  Modalities: Medication Management, Group therapy, Case management,  1 to 1 session with clinician, Psychoeducation, Recreational therapy.   Physician Treatment Plan for Primary Diagnosis: MDD (major depressive disorder) Long Term Goal(s): Improvement in symptoms so as ready for discharge   Short Term Goals: Ability to identify changes in lifestyle to reduce recurrence of condition will improve Ability to verbalize feelings will improve Ability to disclose and discuss suicidal ideas Ability to demonstrate self-control will improve Ability to identify and develop effective coping behaviors will improve Ability to maintain clinical measurements within normal limits will improve Ability to identify triggers associated with substance abuse/mental health issues will improve  Medication Management: Evaluate patient's response, side effects, and tolerance of medication regimen.  Therapeutic Interventions: 1 to 1 sessions, Unit Group sessions and Medication administration.  Evaluation of Outcomes: Not Progressing  Physician Treatment Plan for Secondary Diagnosis: Principal Problem:   MDD (major depressive disorder) Active Problems:   MDD (major depressive disorder), recurrent episode, moderate (HCC)   Alcohol use disorder   Cannabis use disorder  Long Term Goal(s): Improvement in symptoms so as ready for discharge   Short Term Goals: Ability to identify changes in lifestyle to reduce recurrence of condition will improve Ability to verbalize feelings will improve Ability to disclose and discuss suicidal ideas Ability to demonstrate self-control will improve Ability to identify and develop effective coping behaviors will improve Ability to maintain clinical measurements within normal limits will improve Ability to identify triggers associated with substance abuse/mental health issues will improve     Medication Management: Evaluate patient's response, side effects, and tolerance of  medication regimen.  Therapeutic Interventions: 1 to 1 sessions, Unit Group sessions and Medication administration.  Evaluation of Outcomes: Not Progressing   RN Treatment Plan for Primary Diagnosis: MDD (major depressive disorder) Long Term Goal(s): Knowledge of disease and therapeutic regimen to maintain health will improve  Short Term Goals: Ability to remain free from injury will improve, Ability to verbalize frustration and anger appropriately will improve, Ability to demonstrate self-control, Ability to participate in decision making will improve, Ability to verbalize feelings will improve, Ability to disclose and discuss suicidal ideas, Ability to identify and develop effective coping behaviors will improve, and Compliance with prescribed medications will improve  Medication Management: RN will administer  medications as ordered by provider, will assess and evaluate patient's response and provide education to patient for prescribed medication. RN will report any adverse and/or side effects to prescribing provider.  Therapeutic Interventions: 1 on 1 counseling sessions, Psychoeducation, Medication administration, Evaluate responses to treatment, Monitor vital signs and CBGs as ordered, Perform/monitor CIWA, COWS, AIMS and Fall Risk screenings as ordered, Perform wound care treatments as ordered.  Evaluation of Outcomes: Not Progressing   LCSW Treatment Plan for Primary Diagnosis: MDD (major depressive disorder) Long Term Goal(s): Safe transition to appropriate next level of care at discharge, Engage patient in therapeutic group addressing interpersonal concerns.  Short Term Goals: Engage patient in aftercare planning with referrals and resources, Increase social support, Increase ability to appropriately verbalize feelings, Increase emotional regulation, Facilitate acceptance of mental health diagnosis and concerns, Facilitate patient progression through stages of change regarding  substance use diagnoses and concerns, Identify triggers associated with mental health/substance abuse issues, and Increase skills for wellness and recovery  Therapeutic Interventions: Assess for all discharge needs, 1 to 1 time with Social worker, Explore available resources and support systems, Assess for adequacy in community support network, Educate family and significant other(s) on suicide prevention, Complete Psychosocial Assessment, Interpersonal group therapy.  Evaluation of Outcomes: Not Progressing   Progress in Treatment: Attending groups: No. Participating in groups: No. Taking medication as prescribed: Yes. Toleration medication: Yes. Family/Significant other contact made: No, will contact:  consents are pending Patient understands diagnosis: Yes. Discussing patient identified problems/goals with staff: Yes. Medical problems stabilized or resolved: Yes. Denies suicidal/homicidal ideation: Yes. Issues/concerns per patient self-inventory: No.  New problem(s) identified:  No  New Short Term/Long Term Goal(s):    medication stabilization, elimination of SI thoughts, development of comprehensive mental wellness plan.   Patient Goals:  "I have a follow-up plan, and I have clarity on what to do next."    Discharge Plan or Barriers:  Patient recently admitted. CSW will continue to follow and assess for appropriate referrals and possible discharge planning.   Reason for Continuation of Hospitalization: Depression Medication stabilization Suicidal ideation  Estimated Length of Stay:  5 - 7 days  Last 3 [place] Suicide Severity Risk Score: Flowsheet Row [facility] (Current[facility] ED from 05/24/2023 in [facility] at [address] [facility]  C-SSRS RISK CATEGORY High Risk High Risk Moderate Risk       Last [facility] 2/9 Scores:      No data to display          Scribe for Treatment Team: Vineta Carone O Kindred Reidinger, LCSWA 05/29/2023 2:05 PM

## 2023-05-29 NOTE — [facility] Suicide Risk Assessment (Signed)
 [facility] INPATIENT:  Family/Significant Other Suicide Prevention Education  Suicide Prevention Education:  Education Completed; Child's father, Audria Blight [phone],  (name of family member/significant other) has been identified by the patient as the family member/significant other with whom the patient will be residing, and identified as the person(s) who will aid the patient in the event of a mental health crisis (suicidal ideations/suicide attempt).  With written consent from the patient, the family member/significant other has been provided the following suicide prevention education, prior to the and/or following the discharge of the patient.  No safety concerns with patient discharging today, will pick pt up this evening around 6PM.   The suicide prevention education provided includes the following: Suicide risk factors Suicide prevention and interventions National Suicide Hotline telephone number [facility] assessment telephone number [facility] Emergency Assistance 911 [facility] and/or Residential Mobile Crisis Unit telephone number  Request made of family/significant other to: Remove weapons (e.g., guns, rifles, knives), all items previously/currently identified as safety concern.   Remove drugs/medications (over-the-counter, prescriptions, illicit drugs), all items previously/currently identified as a safety concern.  The family member/significant other verbalizes understanding of the suicide prevention education information provided.  The family member/significant other agrees to remove the items of safety concern listed above.  Kim Fritz 05/29/2023, 9:02 AM

## 2023-06-03 ENCOUNTER — Emergency Department (HOSPITAL_COMMUNITY)
Admission: EM | Admit: 2023-06-03 | Discharge: 2023-06-03 | Disposition: A | Discharge status: Home / Self Care | Attending: Emergency Medicine | Admitting: Emergency Medicine

## 2023-06-03 ENCOUNTER — Other Ambulatory Visit: Payer: Self-pay

## 2023-06-03 ENCOUNTER — Emergency Department (HOSPITAL_COMMUNITY)

## 2023-06-03 DIAGNOSIS — J40 Bronchitis, not specified as acute or chronic: Secondary | ICD-10-CM | POA: Diagnosis not present

## 2023-06-03 DIAGNOSIS — J45909 Unspecified asthma, uncomplicated: Secondary | ICD-10-CM | POA: Insufficient documentation

## 2023-06-03 DIAGNOSIS — R059 Cough, unspecified: Secondary | ICD-10-CM | POA: Diagnosis present

## 2023-06-03 LAB — RESP PANEL BY RT-PCR (RSV, FLU A&B, COVID)  RVPGX2
Influenza A by PCR: NEGATIVE
Influenza B by PCR: NEGATIVE
Resp Syncytial Virus by PCR: NEGATIVE
SARS Coronavirus 2 by RT PCR: NEGATIVE

## 2023-06-03 MED ORDER — PREDNISONE 20 MG PO TABS: 20.0000 mg | ORAL_TABLET | Freq: Every day | ORAL | 0 refills | Status: AC

## 2023-06-03 MED ORDER — ACETAMINOPHEN 500 MG PO TABS
1000.0000 mg | ORAL_TABLET | Freq: Once | ORAL | Status: AC
  Administered 2023-06-03: 1000 mg via ORAL
  Filled 2023-06-03: qty 2

## 2023-06-03 MED ORDER — AZITHROMYCIN 250 MG PO TABS: 250.0000 mg | ORAL_TABLET | Freq: Every day | ORAL | 0 refills | Status: AC

## 2023-06-03 MED ORDER — PREDNISONE 20 MG PO TABS
60.0000 mg | ORAL_TABLET | Freq: Once | ORAL | Status: AC
Start: 2023-06-03 — End: 2023-06-03
  Administered 2023-06-03: 60 mg via ORAL
  Filled 2023-06-03: qty 3

## 2023-06-03 MED ORDER — ALBUTEROL SULFATE HFA 108 (90 BASE) MCG/ACT IN AERS
2.0000 | INHALATION_SPRAY | Freq: Once | RESPIRATORY_TRACT | Status: AC
  Administered 2023-06-03: 2 via RESPIRATORY_TRACT
  Filled 2023-06-03: qty 6.7

## 2023-06-03 NOTE — ED Triage Notes (Signed)
 Pt reports SOB x 4 days. Associated productive yellow cough, chills, and suspected fevers.

## 2023-06-03 NOTE — ED Provider Notes (Signed)
 [place] EMERGENCY DEPARTMENT AT [facility] Provider Note   CSN: [id number] Arrival date & time: 06/03/23  2000    History  Chief Complaint  Patient presents with   Shortness of Breath    Kim Fritz is a 28 y.o. female here for evaluation of cough, fever, congestion.  She states this started a few days ago.  She has had a subjective fever, has not documented her temperature.  Has remote history of asthma has intermittently been using her mother's albuterol  inhaler which has helped.  No hemoptysis.  No pain or swelling to extremities.  No pleuritic or exertional chest pain.  No history of PE or DVT.  Some rhinorrhea, congestion.  Unknown sick contacts, [facility] admission for SI about 5 days ago, no current SI, HI, AVH.  Cough productive of yellow sputum.  HPI     Home Medications Prior to Admission medications   Medication Sig Start Date End Date Taking? Authorizing Provider  azithromycin (ZITHROMAX) 250 MG tablet Take 1 tablet (250 mg total) by mouth daily. Take first 2 tablets together, then 1 every day until finished. 06/03/23  Yes Tiena Manansala A, PA-C  predniSONE  (DELTASONE ) 20 MG tablet Take 1 tablet (20 mg total) by mouth daily for 4 days. 06/03/23 06/07/23 Yes Liliya Fullenwider A, PA-C  albuterol  (VENTOLIN  HFA) 108 (90 Base) MCG/ACT inhaler Inhale 1-2 puffs into the lungs every 6 (six) hours as needed for wheezing or shortness of breath. 05/29/23 06/28/23  Chien, Stephanie, MD  Aspirin-Caffeine (BAYER BACK & BODY PO) Take 1-2 tablets by mouth every 6 (six) hours as needed (Pain).    [provider]  cyclobenzaprine  (FLEXERIL ) 10 MG tablet Take 1 tablet (10 mg total) by mouth 2 (two) times daily as needed for up to 10 days for muscle spasms. 05/24/23 06/03/23  Arvilla Birmingham, MD  ibuprofen  (ADVIL ) 800 MG tablet Take 1 tablet (800 mg total) by mouth 3 (three) times daily for 30 doses. Patient taking differently: Take 800 mg by mouth 3 (three) times daily as  needed for moderate pain (pain score 4-6). 05/24/23 06/03/23  Arvilla Birmingham, MD  Multiple Vitamin (MULTIVITAMIN WITH MINERALS) TABS tablet Take 1 tablet by mouth daily. 05/29/23 06/28/23  Chien, Stephanie, MD  nicotine  (NICODERM CQ  - DOSED IN MG/24 HOURS) 21 mg/24hr patch Place 1 patch (21 mg total) onto the skin daily as needed (Nicotine  dependence). 05/29/23   Chien, Stephanie, MD  thiamine  (VITAMIN B-1) 100 MG tablet Take 1 tablet (100 mg total) by mouth daily. 05/30/23 06/29/23  Chien, Stephanie, MD  traZODone  (DESYREL ) 50 MG tablet Take 1 tablet (50 mg total) by mouth at bedtime as needed for sleep. 05/29/23 06/28/23  Norbert Bean, MD      Allergies    Penicillins    Review of Systems   Review of Systems  Constitutional:  Positive for activity change, appetite change, chills and fever.  HENT:  Positive for congestion, postnasal drip and rhinorrhea. Negative for sore throat and trouble swallowing.   Respiratory:  Positive for cough, shortness of breath and wheezing.   Cardiovascular: Negative.   Gastrointestinal: Negative.   Genitourinary: Negative.   Musculoskeletal:  Positive for myalgias.  Skin: Negative.   Neurological: Negative.   All other systems reviewed and are negative.   Physical Exam Updated Vital Signs BP (!) 145/99 (BP Location: Right Arm)   Pulse 100   Temp 98.8 F (37.1 C) (Oral)   Resp 18   Ht 5\' 5"  (1.651  m)   Wt 83.9 kg   LMP 05/24/2023   SpO2 99%   BMI 30.79 kg/m  Physical Exam Vitals and nursing note reviewed.  Constitutional:      General: She is not in acute distress.    Appearance: She is well-developed. She is not ill-appearing, toxic-appearing or diaphoretic.  HENT:     Head: Normocephalic and atraumatic.  Eyes:     Pupils: Pupils are equal, round, and reactive to light.  Cardiovascular:     Rate and Rhythm: Normal rate.     Pulses: Normal pulses.     Heart sounds: Normal heart sounds.  Pulmonary:     Effort: Pulmonary effort is normal. No  respiratory distress.     Breath sounds: Wheezing and rhonchi present.  Chest:     Chest wall: No mass, tenderness or edema.  Abdominal:     General: Bowel sounds are normal. There is no distension.     Palpations: Abdomen is soft.     Tenderness: There is no abdominal tenderness. There is no guarding or rebound.  Musculoskeletal:        General: Normal range of motion.     Cervical back: Normal range of motion.     Right lower leg: No tenderness. No edema.     Left lower leg: No tenderness. No edema.     Comments: No bony tenderness, compartments soft.  Nontender calves bilaterally no edema  Skin:    General: Skin is warm and dry.     Capillary Refill: Capillary refill takes less than 2 seconds.     Comments: No erythema, warmth, fluctuance or induration.  No obvious rashes or lesions on exposed skin  Neurological:     General: No focal deficit present.     Mental Status: She is alert.     Motor: No weakness.  Psychiatric:        Mood and Affect: Mood normal.     ED Results / Procedures / Treatments   Labs (all labs ordered are listed, but only abnormal results are displayed) Labs Reviewed  RESP PANEL BY RT-PCR (RSV, FLU A&B, COVID)  [id number]    EKG None  Radiology DG Chest 2 View Result Date: 06/03/2023 CLINICAL DATA:  Shortness of breath, productive cough for 4 days. EXAM: CHEST - 2 VIEW COMPARISON:  May 24, 2023. FINDINGS: The heart size and mediastinal contours are within normal limits. Both lungs are clear. The visualized skeletal structures are unremarkable. IMPRESSION: No active cardiopulmonary disease. Electronically Signed   By: Rosalene Colon M.D.   On: 06/03/2023 20:25    Procedures Procedures    Medications Ordered in ED Medications  predniSONE  (DELTASONE ) tablet 60 mg (60 mg Oral Given 06/03/23 2053)  acetaminophen  (TYLENOL ) tablet 1,000 mg (1,000 mg Oral Given 06/03/23 2053)  albuterol  (VENTOLIN  HFA) 108 (90 Base) MCG/ACT inhaler 2 puff (2 puffs Inhalation  Provided for home use 06/03/23 2054)   ED Course/ Medical Decision Making/ A&P   28 year old here for evaluation of cough, shortness of breath, wheeze, fevers, myalgias, congestion and rhinorrhea.  No known sick contacts however had a recent behavioral health admission was discharge about 5 days ago.  No current HI, AVH.  Has some mild rhonchi, wheeze bilaterally.  No clinical evidence of VTE on exam.  Abdomen soft, nontender.  I suspect she likely has a viral process, she does have a history of asthma.  Will trial course of steroids, breathing treatment, Tylenol .  She has an oral temp  of 99.9 with me in room.  Labs and imaging personally viewed and interpreted:  Viral panel negative Chest x-ray without acute abnormality  After getting medication patient states she has to go that her ride is here.  Will treat for bronchitis.  Will have her return for any worsening symptoms.  At this time low suspicion for acute PE, dissection, pneumothorax, fluid overload, sepsis.  The patient has been appropriately medically screened and/or stabilized in the ED. I have low suspicion for any other emergent medical condition which would require further screening, evaluation or treatment in the ED or require inpatient management.  Patient is hemodynamically stable and in no acute distress.  Patient able to ambulate in department prior to ED.  Evaluation does not show acute pathology that would require ongoing or additional emergent interventions while in the emergency department or further inpatient treatment.  I have discussed the diagnosis with the patient and answered all questions.  Pain is been managed while in the emergency department and patient has no further complaints prior to discharge.  Patient is comfortable with plan discussed in room and is stable for discharge at this time.  I have discussed strict return precautions for returning to the emergency department.  Patient was encouraged to follow-up with  PCP/specialist refer to at discharge.                                  Medical Decision Making Amount and/or Complexity of Data Reviewed External Data Reviewed: labs, radiology and notes. Labs: ordered. Decision-making details documented in ED Course. Radiology: ordered and independent interpretation performed. Decision-making details documented in ED Course.  Risk OTC drugs. Prescription drug management. Decision regarding hospitalization. Diagnosis or treatment significantly limited by social determinants of health.          Final Clinical Impression(s) / ED Diagnoses Final diagnoses:  Bronchitis    Rx / DC Orders ED Discharge Orders          Ordered    predniSONE  (DELTASONE ) 20 MG tablet  Daily        06/03/23 2111    azithromycin (ZITHROMAX) 250 MG tablet  Daily        06/03/23 2111              Lavine Hargrove A, PA-C 06/03/23 2123    Lind Repine, MD 06/04/23 [phone]

## 2023-06-03 NOTE — Discharge Instructions (Addendum)
 It was a pleasure taking care of you here today  Your viral panel is negative and your chest x-ray did not show pneumonia however you possibly have bronchitis given your wheezing.  We have given you an inhaler.  2 puffs every 4 hours as needed.  Also given you a course of steroids.  I have started you on a short course of antibiotics.  Tylenol  or Motrin  as needed for pain or fever.  Do not take more than 4000 mg Tylenol  or more than 2400 mg ibuprofen  in 24-hour.  Follow-up outpatient, return for any worsening symptoms

## 2023-08-26 ENCOUNTER — Other Ambulatory Visit: Payer: Self-pay

## 2023-08-26 ENCOUNTER — Emergency Department (HOSPITAL_COMMUNITY)
Admission: EM | Admit: 2023-08-26 | Discharge: 2023-08-26 | Disposition: A | Discharge status: Home / Self Care | Attending: Emergency Medicine | Admitting: Emergency Medicine

## 2023-08-26 DIAGNOSIS — N760 Acute vaginitis: Secondary | ICD-10-CM | POA: Insufficient documentation

## 2023-08-26 DIAGNOSIS — Z7982 Long term (current) use of aspirin: Secondary | ICD-10-CM | POA: Insufficient documentation

## 2023-08-26 DIAGNOSIS — Z87891 Personal history of nicotine dependence: Secondary | ICD-10-CM | POA: Diagnosis not present

## 2023-08-26 LAB — URINALYSIS, ROUTINE W REFLEX MICROSCOPIC
Bilirubin Urine: NEGATIVE
Glucose, UA: NEGATIVE mg/dL
Ketones, ur: NEGATIVE mg/dL
Leukocytes,Ua: NEGATIVE
Nitrite: NEGATIVE
Protein, ur: NEGATIVE mg/dL
Specific Gravity, Urine: 1.019 (ref 1.005–1.030)
pH: 6 (ref 5.0–8.0)

## 2023-08-26 LAB — WET PREP, GENITAL
Sperm: NONE SEEN
Trich, Wet Prep: NONE SEEN
WBC, Wet Prep HPF POC: <10 (ref <10)
Yeast Wet Prep HPF POC: NONE SEEN

## 2023-08-26 LAB — HIV ANTIBODY (ROUTINE TESTING W REFLEX): HIV Screen 4th Generation wRfx: NONREACTIVE

## 2023-08-26 LAB — HCG, SERUM, QUALITATIVE: Preg, Serum: NEGATIVE

## 2023-08-26 MED ORDER — METRONIDAZOLE 500 MG PO TABS: 500.0000 mg | ORAL_TABLET | Freq: Two times a day (BID) | ORAL | 0 refills | Status: AC

## 2023-08-26 NOTE — ED Triage Notes (Signed)
 Patient to ED by POV with c/o vaginal discomfort x1 week. She reports burning but denies drainage or blood in urine.

## 2023-08-26 NOTE — Discharge Instructions (Addendum)
 It was a pleasure taking care of you today. Based on your history, physical exam, labs I feel you are safe for discharge.  You have been prescribed an antibiotic to treat for bacterial vaginosis.  Please take this antibiotic as prescribed and complete the full course.  Today you were tested for other possible infections.  If these are positive.  Pharmacy will call you.  If symptoms persist over the next 3 to 4 days I do recommend that you follow-up with your primary care provider for ongoing treatment.  If you experience the following symptoms including but not limited to fever, chills, abdominal pain, pelvic pain, chest pain, shortness of breath, blood in urine, or other concerning symptom please return the emergency department or seek further medical care.

## 2023-08-26 NOTE — ED Provider Notes (Signed)
 [place] EMERGENCY DEPARTMENT AT [facility] Provider Note   CSN: [id number] Arrival date & time: 08/26/23  1201     Patient presents with: Vaginitis   Kim Fritz is a 28 y.o. female who presents to the emergency department with a chief complaint of vaginal irritation x 1 week.  Patient states that she does have some vaginal burning but denies discharge or blood in urine.  Patient states that this burning is not present with only urination, states that it is somewhat constant.  States that she has had 2 new sexual partners recently, and use protection both times however in one of the instances the condom came off.  Denies knowledge that either of these partners were infected with a sexually transmitted disease.  Patient states that she would like testing for all STDs.  Past medical history significant for history of major depression, acute pyelonephritis, sepsis, renal insufficiency, tobacco abuse, alcohol use disorder, cannabis use disorder, etc.   HPI     Prior to Admission medications   Medication Sig Start Date End Date Taking? Authorizing Provider  metroNIDAZOLE  (FLAGYL ) 500 MG tablet Take 1 tablet (500 mg total) by mouth 2 (two) times daily for 7 days. 08/26/23 09/02/23 Yes Kashonda Sarkisyan F, PA-C  albuterol  (VENTOLIN  HFA) 108 (90 Base) MCG/ACT inhaler Inhale 1-2 puffs into the lungs every 6 (six) hours as needed for wheezing or shortness of breath. 05/29/23 06/28/23  Chien, Stephanie, MD  Aspirin-Caffeine (BAYER BACK & BODY PO) Take 1-2 tablets by mouth every 6 (six) hours as needed (Pain).    [provider]  azithromycin  (ZITHROMAX ) 250 MG tablet Take 1 tablet (250 mg total) by mouth daily. Take first 2 tablets together, then 1 every day until finished. 06/03/23   Henderly, Britni A, PA-C  nicotine  (NICODERM CQ  - DOSED IN MG/24 HOURS) 21 mg/24hr patch Place 1 patch (21 mg total) onto the skin daily as needed (Nicotine  dependence). 05/29/23   Chien, Stephanie, MD   traZODone  (DESYREL ) 50 MG tablet Take 1 tablet (50 mg total) by mouth at bedtime as needed for sleep. 05/29/23 06/28/23  Graham Krabbe, MD    Allergies: Penicillins    Review of Systems  Genitourinary:  Positive for vaginal pain.    Updated Vital Signs BP (!) 127/101 (BP Location: Left Arm)   Pulse 79   Temp 98.7 F (37.1 C) (Oral)   Resp 16   Ht 5' 5 (1.651 m)   Wt 85.3 kg   LMP 07/26/2023   SpO2 93%   BMI 31.28 kg/m   Physical Exam Vitals and nursing note reviewed.  Constitutional:      General: She is awake. She is not in acute distress.    Appearance: Normal appearance. She is not ill-appearing, toxic-appearing or diaphoretic.  HENT:     Head: Normocephalic and atraumatic.  Eyes:     General: No scleral icterus. Pulmonary:     Effort: Pulmonary effort is normal. No respiratory distress.  Abdominal:     General: Abdomen is flat.     Palpations: Abdomen is soft.     Tenderness: There is no abdominal tenderness. There is no right CVA tenderness, left CVA tenderness, guarding or rebound.  Musculoskeletal:        General: Normal range of motion.     Right lower leg: No edema.     Left lower leg: No edema.  Skin:    General: Skin is warm and dry.     Capillary Refill: Capillary refill  takes less than 2 seconds.  Neurological:     General: No focal deficit present.     Mental Status: She is alert and oriented to person, place, and time.  Psychiatric:        Mood and Affect: Mood normal.        Behavior: Behavior normal. Behavior is cooperative.     (all labs ordered are listed, but only abnormal results are displayed) Labs Reviewed  WET PREP, GENITAL - Abnormal; Notable for the following components:      Result Value   Clue Cells Wet Prep HPF POC PRESENT (*)    All other components within normal limits  URINALYSIS, ROUTINE W REFLEX MICROSCOPIC - Abnormal; Notable for the following components:   APPearance HAZY (*)    Hgb urine dipstick SMALL (*)     Bacteria, UA RARE (*)    All other components within normal limits  HIV ANTIBODY (ROUTINE TESTING W REFLEX)  HCG, SERUM, QUALITATIVE  RPR  GC/CHLAMYDIA PROBE AMP ([place]) NOT AT [facility]    EKG: None  Radiology: No results found.   Procedures   Medications Ordered in the ED - No data to display                                  Medical Decision Making Amount and/or Complexity of Data Reviewed Labs: ordered.  Risk Prescription drug management.   Patient presents to the ED for concern of vaginal discomfort, this involves an extensive number of treatment options, and is a complaint that carries with it a high risk of complications and morbidity.  The differential diagnosis includes urinary tract infection, sexually-transmitted disease, yeast infection, herpes, trauma/injury, etc.   Co morbidities that complicate the patient evaluation  2 new sexual partners, history of major depression, acute pyelonephritis, sepsis, renal insufficiency, tobacco abuse, alcohol use disorder, cannabis use disorder, etc.   Lab Tests:  I Ordered, and personally interpreted labs.  The pertinent results include: Urinalysis not overwhelming for infection, serum qualitative pregnancy negative, clue cells present on wet prep, HIV non-reactive   Medicines ordered and prescription drug management: I have reviewed the patients home medicines and have made adjustments as needed   Test Considered:  None   Critical Interventions:  None   Problem List / ED Course:  28 year old female, vaginal discomfort x 1 week, recently had 2 new sexual partners and would like STD testing Vital signs stable, on physical exam patient not in acute distress, declines vaginal examination at this time, would like to self swab, I informed this patient that if it anytime they would prefer to have a pelvic examination completed they could return to the emergency department for further workup Patient self swabbed  for gonorrhea chlamydia, orders placed for further sexually-transmitted diseases including HIV, syphilis, wet prep, UA does not show signs of infection, blood pregnancy test ordered as well Wet prep significant for presence of clue cells, UA not overwhelming for infection, serum pregnancy negative, HIV nonreactive, patient educated on these findings, prescribed outpatient course of Flagyl  for possible BV, patient recommended to follow-up with your primary care provider if symptoms persist or worsen.  Patient instructed that they will receive a call from the lab if positive for sexually-transmitted infection. Return precautions given and patient understanding of plan at time of discharge Patient discharged Most likely diagnosis at this time is possible bacterial vaginitis versus bacterial vaginosis.  Patient declined pelvic examination  at this time.  Urine not overwhelming for infection, HIV negative, pregnancy negative.  Rest of STD panel pending.   Reevaluation:  After the interventions noted above, I reevaluated the patient and found that they have :stayed the same   Social Determinants of Health:  none   Dispostion:  After consideration of the diagnostic results and the patients response to treatment, I feel that the patent would benefit from discharge and outpatient therapy as prescribed, follow-up with primary care provider and specialist as scheduled, sooner if symptoms warrant.  Labs should call if STDs positive.     Final diagnoses:  Acute vaginitis    ED Discharge Orders          Ordered    metroNIDAZOLE  (FLAGYL ) 500 MG tablet  2 times daily        08/26/23 [address] Darnice Comrie F, PA-C 08/26/23 2308    Randol Simmonds, MD 08/27/23 [phone]

## 2023-08-26 NOTE — ED Notes (Signed)
ED PA at [facility] 

## 2023-08-27 LAB — RPR: RPR Ser Ql: NONREACTIVE

## 2023-08-27 LAB — GC/CHLAMYDIA PROBE AMP (~~LOC~~) NOT AT ARMC
Chlamydia: NEGATIVE
Comment: NEGATIVE
Comment: NORMAL
Neisseria Gonorrhea: NEGATIVE

## 2024-02-04 ENCOUNTER — Emergency Department (HOSPITAL_COMMUNITY)
Admission: EM | Admit: 2024-02-04 | Discharge: 2024-02-04 | Disposition: A | Discharge status: Home / Self Care | Attending: Emergency Medicine | Admitting: Emergency Medicine

## 2024-02-04 ENCOUNTER — Other Ambulatory Visit: Payer: Self-pay

## 2024-02-04 ENCOUNTER — Encounter (HOSPITAL_COMMUNITY): Payer: Self-pay

## 2024-02-04 DIAGNOSIS — Z7982 Long term (current) use of aspirin: Secondary | ICD-10-CM | POA: Insufficient documentation

## 2024-02-04 DIAGNOSIS — R000 Tachycardia, unspecified: Secondary | ICD-10-CM | POA: Diagnosis not present

## 2024-02-04 DIAGNOSIS — J029 Acute pharyngitis, unspecified: Secondary | ICD-10-CM | POA: Diagnosis present

## 2024-02-04 DIAGNOSIS — J039 Acute tonsillitis, unspecified: Secondary | ICD-10-CM | POA: Diagnosis not present

## 2024-02-04 LAB — RESP PANEL BY RT-PCR (RSV, FLU A&B, COVID)  RVPGX2
Influenza A by PCR: NEGATIVE
Influenza B by PCR: NEGATIVE
Resp Syncytial Virus by PCR: NEGATIVE
SARS Coronavirus 2 by RT PCR: NEGATIVE

## 2024-02-04 LAB — GROUP A STREP BY PCR: Group A Strep by PCR: NOT DETECTED

## 2024-02-04 LAB — PREGNANCY, URINE: Preg Test, Ur: NEGATIVE

## 2024-02-04 MED ORDER — DEXAMETHASONE SOD PHOSPHATE PF 10 MG/ML IJ SOLN
10.0000 mg | Freq: Once | INTRAMUSCULAR | Status: AC
  Administered 2024-02-04: 10 mg via INTRAVENOUS
  Filled 2024-02-04: qty 1

## 2024-02-04 MED ORDER — CLINDAMYCIN HCL 150 MG PO CAPS: 300.0000 mg | ORAL_CAPSULE | Freq: Three times a day (TID) | ORAL | 0 refills | Status: AC

## 2024-02-04 MED ORDER — KETOROLAC TROMETHAMINE 30 MG/ML IJ SOLN
7.5000 mg | Freq: Once | INTRAMUSCULAR | Status: AC
  Administered 2024-02-04: 7.5 mg via INTRAVENOUS
  Filled 2024-02-04: qty 1

## 2024-02-04 MED ORDER — CLINDAMYCIN PHOSPHATE 600 MG/50ML IV SOLN
600.0000 mg | Freq: Once | INTRAVENOUS | Status: AC
  Administered 2024-02-04: 600 mg via INTRAVENOUS
  Filled 2024-02-04: qty 50

## 2024-02-04 MED ORDER — SODIUM CHLORIDE 0.9 % IV BOLUS
1000.0000 mL | Freq: Once | INTRAVENOUS | Status: AC
  Administered 2024-02-04: 1000 mL via INTRAVENOUS

## 2024-02-04 MED ORDER — ACETAMINOPHEN 500 MG PO TABS
1000.0000 mg | ORAL_TABLET | Freq: Once | ORAL | Status: AC
  Administered 2024-02-04: 1000 mg via ORAL
  Filled 2024-02-04: qty 2

## 2024-02-04 NOTE — Discharge Instructions (Addendum)
 Monitor your condition carefully and do not hesitate to return here.  Otherwise follow-up with our ENT colleagues.

## 2024-02-04 NOTE — ED Provider Notes (Signed)
 " [place] EMERGENCY DEPARTMENT AT [facility] Provider Note   CSN: [id number] Arrival date & time: 02/04/24  1825     Patient presents with: Fever and Sore Throat   Kim Fritz is a 29 y.o. female.   HPI Patient presents with sore throat, fever.  She notes a history of prior episodes of tonsillitis.  This episode of throat pain, fever began yesterday.  Difficulty swallowing, but no dyspnea, nor difficulty speaking.  No other systemic complaints. Patient is here with female companion who assists with the history.  Minimal relief with Tylenol .    Prior to Admission medications  Medication Sig Start Date End Date Taking? Authorizing Provider  clindamycin  (CLEOCIN ) 150 MG capsule Take 2 capsules (300 mg total) by mouth 3 (three) times daily for 7 days. 02/04/24 02/11/24 Yes Garrick Charleston, MD  albuterol  (VENTOLIN  HFA) [phone] Base) MCG/ACT inhaler Inhale 1-2 puffs into the lungs every 6 (six) hours as needed for wheezing or shortness of breath. 05/29/23 06/28/23  Chien, Stephanie, MD  Aspirin-Caffeine (BAYER BACK & BODY PO) Take 1-2 tablets by mouth every 6 (six) hours as needed (Pain).    [provider]  azithromycin  (ZITHROMAX ) 250 MG tablet Take 1 tablet (250 mg total) by mouth daily. Take first 2 tablets together, then 1 every day until finished. 06/03/23   Henderly, Britni A, PA-C  nicotine  (NICODERM CQ  - DOSED IN MG/24 HOURS) 21 mg/24hr patch Place 1 patch (21 mg total) onto the skin daily as needed (Nicotine  dependence). 05/29/23   Chien, Stephanie, MD  traZODone  (DESYREL ) 50 MG tablet Take 1 tablet (50 mg total) by mouth at bedtime as needed for sleep. 05/29/23 06/28/23  Chien, Stephanie, MD    Allergies: Penicillins    Review of Systems  Updated Vital Signs BP (!) 131/91 (BP Location: Left Arm)   Pulse (!) 101   Temp 98.6 F (37 C) (Oral)   Resp 16   Ht 1.651 m (5' 5)   Wt 83 kg   LMP 01/22/2024 (Approximate)   SpO2 99%   Breastfeeding No   BMI 30.45 kg/m    Physical Exam Vitals and nursing note reviewed.  Constitutional:      General: She is not in acute distress.    Appearance: She is well-developed.  HENT:     Head: Normocephalic and atraumatic.     Mouth/Throat:     Mouth: Mucous membranes are moist.     Pharynx: Uvula midline. Pharyngeal swelling (Symmetric bilateral tonsillar edema) and posterior oropharyngeal erythema present. No oropharyngeal exudate or uvula swelling.  Eyes:     Conjunctiva/sclera: Conjunctivae normal.  Cardiovascular:     Rate and Rhythm: Regular rhythm. Tachycardia present.  Pulmonary:     Effort: Pulmonary effort is normal. No respiratory distress.     Breath sounds: Normal breath sounds. No stridor.  Abdominal:     General: There is no distension.  Skin:    General: Skin is warm and dry.  Neurological:     Mental Status: She is alert and oriented to person, place, and time.     Cranial Nerves: No cranial nerve deficit.  Psychiatric:        Mood and Affect: Mood normal.     (all labs ordered are listed, but only abnormal results are displayed) Labs Reviewed  GROUP A STREP BY PCR  RESP PANEL BY RT-PCR (RSV, FLU A&B, COVID)  [id number]  PREGNANCY, URINE    EKG: None  Radiology: No results found.  Procedures   Medications Ordered in the ED  acetaminophen  (TYLENOL ) tablet 1,000 mg (1,000 mg Oral Given 02/04/24 1905)  sodium chloride  0.9 % bolus 1,000 mL (1,000 mLs Intravenous New Bag/Given 02/04/24 2127)  dexamethasone  (DECADRON ) injection 10 mg (10 mg Intravenous Given 02/04/24 2124)  ketorolac  (TORADOL ) 30 MG/ML injection 7.5 mg (7.5 mg Intravenous Given 02/04/24 2125)  clindamycin  (CLEOCIN ) IVPB 600 mg (600 mg Intravenous New Bag/Given 02/04/24 2135)                                    Medical Decision Making Generally well adult female who acknowledges history of recurrent tonsillitis presents with sore throat, is found to be febrile, tachycardic, meeting SIRS criteria, but is in no distress, is  not hypotensive. With concern for tonsillitis versus strep versus viral syndrome patient had labs from triage, received fluids, Tylenol , clindamycin  given her allergy history.  Exam without evidence for airway compromise, respiratory compromise, and after provision of antibiotics, fluids, patient improved.  On discharge patient will follow-up with ENT continue clindamycin .   Amount and/or Complexity of Data Reviewed Independent Historian: friend Labs:  Decision-making details documented in ED Course.  Risk Prescription drug management. Decision regarding hospitalization.  Update: Patient in similar condition, now less tachycardic      Final diagnoses:  Tonsillitis    ED Discharge Orders          Ordered    clindamycin  (CLEOCIN ) 150 MG capsule  3 times daily        02/04/24 2155               Garrick Charleston, MD 02/04/24 2213  "

## 2024-02-04 NOTE — ED Triage Notes (Signed)
 Pt ambulatory to triage with c/o fever and sore throat that started yesterday.  NAD noted.

## 2024-02-04 NOTE — ED Provider Triage Note (Signed)
 Emergency Medicine Provider Triage Evaluation Note  Kim Fritz , a 29 y.o. female  was evaluated in triage.  Pt complains of throat, generalized bodyaches, fever that started 2 days ago.  Endorses nausea and vomiting.  Denies blood in vomit.  Denies contacts.  Denies taking any medicine for symptoms.  Denies chest pain, shortness of breath, diarrhea, abdominal pain.  Denies neck pain.  Physical Exam  BP (!) 135/103 (BP Location: Right Arm)   Pulse (!) 118   Temp (!) 103 F (39.4 C) (Oral)   Resp (!) 24   Ht 5' 5 (1.651 m)   Wt 83 kg   LMP 01/22/2024 (Approximate)   SpO2 98%   Breastfeeding No   BMI 30.45 kg/m  Gen:   Awake, no distress   Resp:  Normal effort,.  Clear lung sounds MSK:   Moves extremities without difficulty  Other:  Tonsils enlarged but not erythematous.  Uvula midline.  No signs of peritonsillar abscess or retropharyngeal abscess.  No Trismus.  Medical Decision Making  Medically screening exam initiated at 6:59 PM.  Appropriate orders placed.  Kim Fritz was informed that the remainder of the evaluation will be completed by another provider, this initial triage assessment does not replace that evaluation, and the importance of remaining in the ED until their evaluation is complete.  Strep, RVP, urinalysis, give Tylenol  for fever   Braxton Dubois, PA-C 02/04/24 1902
